# Patient Record
Sex: Male | Born: 1935 | Race: White | Hispanic: No | Marital: Married | Smoking: Former smoker
Health system: Southern US, Community
[De-identification: ages and names within clinical notes are randomized; demographics above are authoritative.]

## PROBLEM LIST (undated history)

## (undated) DIAGNOSIS — H9190 Unspecified hearing loss, unspecified ear: Secondary | ICD-10-CM

## (undated) DIAGNOSIS — F0390 Unspecified dementia without behavioral disturbance: Secondary | ICD-10-CM

## (undated) DIAGNOSIS — I429 Cardiomyopathy, unspecified: Secondary | ICD-10-CM

## (undated) DIAGNOSIS — H548 Legal blindness, as defined in USA: Secondary | ICD-10-CM

## (undated) DIAGNOSIS — Z515 Encounter for palliative care: Secondary | ICD-10-CM

## (undated) HISTORY — PX: CATARACT EXTRACTION, BILATERAL: SHX1313

## (undated) HISTORY — PX: PACEMAKER INSERTION: SHX728

## (undated) HISTORY — PX: INSERT / REPLACE / REMOVE PACEMAKER: SUR710

## (undated) HISTORY — PX: TONSILLECTOMY: SUR1361

---

## 1958-11-09 HISTORY — PX: GASTRECTOMY: SHX58

## 1999-10-24 ENCOUNTER — Ambulatory Visit (HOSPITAL_COMMUNITY): Admission: RE | Admit: 1999-10-24 | Discharge: 1999-10-25 | Payer: Self-pay

## 2001-04-27 ENCOUNTER — Ambulatory Visit (HOSPITAL_COMMUNITY): Admission: RE | Admit: 2001-04-27 | Discharge: 2001-04-27 | Payer: Self-pay | Admitting: Ophthalmology

## 2004-05-27 ENCOUNTER — Inpatient Hospital Stay (HOSPITAL_COMMUNITY): Admission: EM | Admit: 2004-05-27 | Discharge: 2004-05-28 | Payer: Self-pay | Admitting: Emergency Medicine

## 2005-03-04 ENCOUNTER — Inpatient Hospital Stay (HOSPITAL_COMMUNITY): Admission: EM | Admit: 2005-03-04 | Discharge: 2005-03-14 | Payer: Self-pay | Admitting: Emergency Medicine

## 2005-03-04 ENCOUNTER — Ambulatory Visit: Payer: Self-pay | Admitting: Family Medicine

## 2006-03-06 ENCOUNTER — Inpatient Hospital Stay (HOSPITAL_COMMUNITY): Admission: EM | Admit: 2006-03-06 | Discharge: 2006-03-11 | Payer: Self-pay | Admitting: *Deleted

## 2008-11-29 ENCOUNTER — Emergency Department (HOSPITAL_COMMUNITY): Admission: EM | Admit: 2008-11-29 | Discharge: 2008-11-29 | Payer: Self-pay | Admitting: Emergency Medicine

## 2009-05-31 ENCOUNTER — Inpatient Hospital Stay (HOSPITAL_COMMUNITY): Admission: EM | Admit: 2009-05-31 | Discharge: 2009-06-04 | Payer: Self-pay | Admitting: Emergency Medicine

## 2009-05-31 ENCOUNTER — Ambulatory Visit: Payer: Self-pay | Admitting: Internal Medicine

## 2009-06-05 ENCOUNTER — Encounter: Payer: Self-pay | Admitting: Internal Medicine

## 2009-06-17 ENCOUNTER — Ambulatory Visit: Payer: Self-pay

## 2009-06-17 ENCOUNTER — Encounter: Payer: Self-pay | Admitting: Internal Medicine

## 2009-10-14 ENCOUNTER — Encounter: Payer: Self-pay | Admitting: Internal Medicine

## 2010-04-26 ENCOUNTER — Ambulatory Visit: Payer: Self-pay | Admitting: Family Medicine

## 2010-04-26 ENCOUNTER — Inpatient Hospital Stay (HOSPITAL_COMMUNITY): Admission: EM | Admit: 2010-04-26 | Discharge: 2010-04-28 | Payer: Self-pay | Admitting: Emergency Medicine

## 2010-04-27 ENCOUNTER — Encounter: Payer: Self-pay | Admitting: Family Medicine

## 2010-04-27 ENCOUNTER — Ambulatory Visit: Payer: Self-pay | Admitting: Vascular Surgery

## 2010-05-09 ENCOUNTER — Encounter: Admission: RE | Admit: 2010-05-09 | Discharge: 2010-05-09 | Payer: Self-pay | Admitting: Emergency Medicine

## 2011-01-26 LAB — POCT I-STAT, CHEM 8
BUN: 15 mg/dL (ref 6–23)
Calcium, Ion: 1.1 mmol/L — ABNORMAL LOW (ref 1.12–1.32)
Chloride: 97 mEq/L (ref 96–112)
Glucose, Bld: 120 mg/dL — ABNORMAL HIGH (ref 70–99)
Hemoglobin: 12.6 g/dL — ABNORMAL LOW (ref 13.0–17.0)
Sodium: 130 mEq/L — ABNORMAL LOW (ref 135–145)
TCO2: 27 mmol/L (ref 0–100)

## 2011-01-26 LAB — DIFFERENTIAL
Lymphocytes Relative: 12 % (ref 12–46)
Monocytes Absolute: 0.5 10*3/uL (ref 0.1–1.0)
Neutro Abs: 6.2 10*3/uL (ref 1.7–7.7)
Neutrophils Relative %: 79 % — ABNORMAL HIGH (ref 43–77)

## 2011-01-26 LAB — COMPREHENSIVE METABOLIC PANEL
ALT: 20 U/L (ref 0–53)
Albumin: 3.4 g/dL — ABNORMAL LOW (ref 3.5–5.2)
Alkaline Phosphatase: 50 U/L (ref 39–117)
BUN: 12 mg/dL (ref 6–23)
CO2: 28 mEq/L (ref 19–32)
Chloride: 100 mEq/L (ref 96–112)
Creatinine, Ser: 0.77 mg/dL (ref 0.4–1.5)
GFR calc Af Amer: >60 mL/min (ref >60)
Glucose, Bld: 133 mg/dL — ABNORMAL HIGH (ref 70–99)
Potassium: 3.7 mEq/L (ref 3.5–5.1)

## 2011-01-26 LAB — POCT CARDIAC MARKERS: Myoglobin, poc: 42 ng/mL (ref 12–200)

## 2011-01-26 LAB — CARDIAC PANEL(CRET KIN+CKTOT+MB+TROPI)
Relative Index: INVALID (ref 0.0–2.5)
Total CK: 51 U/L (ref 7–232)
Troponin I: 0.03 ng/mL (ref 0.00–0.06)

## 2011-01-26 LAB — URINALYSIS, ROUTINE W REFLEX MICROSCOPIC
Glucose, UA: NEGATIVE mg/dL
Ketones, ur: NEGATIVE mg/dL
Leukocytes, UA: NEGATIVE
Nitrite: NEGATIVE
Protein, ur: NEGATIVE mg/dL
Urobilinogen, UA: 1 mg/dL (ref 0.0–1.0)
pH: 7.5 (ref 5.0–8.0)

## 2011-01-26 LAB — TSH
TSH: 1.113 u[IU]/mL (ref 0.350–4.500)
TSH: 1.142 u[IU]/mL (ref 0.350–4.500)

## 2011-01-26 LAB — PREALBUMIN: Prealbumin: 18.5 mg/dL (ref 18.0–45.0)

## 2011-01-26 LAB — DIGOXIN LEVEL: Digoxin Level: 1.4 ng/mL (ref 0.8–2.0)

## 2011-01-26 LAB — PROTIME-INR: INR: 1.01 (ref 0.00–1.49)

## 2011-01-26 LAB — LIPID PANEL: VLDL: 13 mg/dL (ref 0–40)

## 2011-01-26 LAB — URINE CULTURE

## 2011-01-26 LAB — CBC
HCT: 36.7 % — ABNORMAL LOW (ref 39.0–52.0)
MCHC: 35.1 g/dL (ref 30.0–36.0)
MCV: 90.3 fL (ref 78.0–100.0)
RDW: 13.9 % (ref 11.5–15.5)

## 2011-01-26 LAB — HOMOCYSTEINE: Homocysteine: 11.1 umol/L (ref 4.0–15.4)

## 2011-01-26 LAB — GLUCOSE, CAPILLARY
Glucose-Capillary: 104 mg/dL — ABNORMAL HIGH (ref 70–99)
Glucose-Capillary: 110 mg/dL — ABNORMAL HIGH (ref 70–99)
Glucose-Capillary: 143 mg/dL — ABNORMAL HIGH (ref 70–99)

## 2011-01-26 LAB — URINE MICROSCOPIC-ADD ON

## 2011-02-15 LAB — CBC
HCT: 32.5 % — ABNORMAL LOW (ref 39.0–52.0)
HCT: 35 % — ABNORMAL LOW (ref 39.0–52.0)
Hemoglobin: 11 g/dL — ABNORMAL LOW (ref 13.0–17.0)
Hemoglobin: 11.3 g/dL — ABNORMAL LOW (ref 13.0–17.0)
Hemoglobin: 12 g/dL — ABNORMAL LOW (ref 13.0–17.0)
MCHC: 34.3 g/dL (ref 30.0–36.0)
MCV: 91.7 fL (ref 78.0–100.0)
MCV: 92.3 fL (ref 78.0–100.0)
RBC: 3.46 MIL/uL — ABNORMAL LOW (ref 4.22–5.81)
RBC: 3.79 MIL/uL — ABNORMAL LOW (ref 4.22–5.81)
RDW: 14.1 % (ref 11.5–15.5)
WBC: 6.1 10*3/uL (ref 4.0–10.5)
WBC: 6.4 10*3/uL (ref 4.0–10.5)
WBC: 7.4 10*3/uL (ref 4.0–10.5)

## 2011-02-15 LAB — PROTIME-INR: Prothrombin Time: 13.7 seconds (ref 11.6–15.2)

## 2011-02-15 LAB — URINALYSIS, ROUTINE W REFLEX MICROSCOPIC
Glucose, UA: 500 mg/dL — AB
Hgb urine dipstick: NEGATIVE
Ketones, ur: NEGATIVE mg/dL
pH: 5.5 (ref 5.0–8.0)

## 2011-02-15 LAB — COMPREHENSIVE METABOLIC PANEL
Alkaline Phosphatase: 40 U/L (ref 39–117)
BUN: 18 mg/dL (ref 6–23)
Chloride: 107 mEq/L (ref 96–112)
GFR calc non Af Amer: >60 mL/min (ref >60)
Glucose, Bld: 97 mg/dL (ref 70–99)
Potassium: 4.2 mEq/L (ref 3.5–5.1)
Total Bilirubin: 0.9 mg/dL (ref 0.3–1.2)

## 2011-02-15 LAB — DIFFERENTIAL
Basophils Relative: 0 % (ref 0–1)
Eosinophils Absolute: 0.1 10*3/uL (ref 0.0–0.7)
Eosinophils Relative: 1 % (ref 0–5)
Lymphs Abs: 0.6 10*3/uL — ABNORMAL LOW (ref 0.7–4.0)
Monocytes Absolute: 0.4 10*3/uL (ref 0.1–1.0)
Monocytes Relative: 6 % (ref 3–12)
Neutrophils Relative %: 84 % — ABNORMAL HIGH (ref 43–77)

## 2011-02-15 LAB — BASIC METABOLIC PANEL
CO2: 27 mEq/L (ref 19–32)
CO2: 28 mEq/L (ref 19–32)
Chloride: 104 mEq/L (ref 96–112)
Chloride: 105 mEq/L (ref 96–112)
GFR calc Af Amer: >60 mL/min (ref >60)
Glucose, Bld: 111 mg/dL — ABNORMAL HIGH (ref 70–99)
Potassium: 3.8 mEq/L (ref 3.5–5.1)
Potassium: 5.2 mEq/L — ABNORMAL HIGH (ref 3.5–5.1)
Sodium: 137 mEq/L (ref 135–145)
Sodium: 137 mEq/L (ref 135–145)

## 2011-02-15 LAB — POCT CARDIAC MARKERS
CKMB, poc: <1 ng/mL — ABNORMAL LOW (ref 1.0–8.0)
Myoglobin, poc: 66.3 ng/mL (ref 12–200)
Myoglobin, poc: 87.3 ng/mL (ref 12–200)

## 2011-02-15 LAB — CARDIAC PANEL(CRET KIN+CKTOT+MB+TROPI)
CK, MB: 1.6 ng/mL (ref 0.3–4.0)
Relative Index: 1.3 (ref 0.0–2.5)

## 2011-02-15 LAB — MAGNESIUM: Magnesium: 2.2 mg/dL (ref 1.5–2.5)

## 2011-02-15 LAB — CK TOTAL AND CKMB (NOT AT ARMC): Relative Index: INVALID (ref 0.0–2.5)

## 2011-02-15 LAB — DIGOXIN LEVEL: Digoxin Level: 0.6 ng/mL — ABNORMAL LOW (ref 0.8–2.0)

## 2011-03-24 NOTE — Discharge Summary (Signed)
NAME:  Joshua, Hester NO.:  [id number]   MEDICAL RECORD NO.:  [id number]          PATIENT TYPE:  INP   LOCATION:  3728                         FACILITY:  [facility]   PHYSICIAN:  Antonieta Iba, MD   DATE OF BIRTH:  [birth year]   DATE OF ADMISSION:  05/31/2009  DATE OF DISCHARGE:  06/04/2009                               DISCHARGE SUMMARY   ADDENDUM:  This is an addendum to a discharge summary done yesterday on  June 03, 2009.  [name] was to be discharged on June 03, 2009.  However, he had symptomatic orthostatic blood pressure changes.  Thus,  he was given a 250 bolus of fluid and then 1 liter of fluid overnight.  His laying blood pressure was 90/43.  His sitting blood pressure was  77/41, and standing was 76/42.  He had a fall during the night.  It was  dark; he was a little confused.  He got out of his bed.  He tripped over  the edge of the bed and he was found on all fours crawling to his bed  and he had urinated in his, I believe, water bucket.  He did not sustain  any injuries.  His pacer pocket on the right was mild-to-moderate  swollen.  His orthostatic blood pressures were checked on June 04, 2009.  His laying was 135/69, sitting was 121/64, and standing was 114/62,  after 3 minutes it was 124/65.  He also had a chest x-ray on June 04, 2009 which showed multiple lead pacemaker and ICD in place.  No  pneumothorax was evident.  No acute process was identified.  It was  decided to cut back on his Coreg and to hold his Plavix for at least a 1-  week period of time to let his wound site heal.  Other discharge  instructions are the same as dictated on June 03, 2009.   DISCHARGE MEDICATIONS:  1. Uroxatral 10 mg one every day.  2. He is to hold his Plavix 75 mg for 1 week.  3. Ocuvite 1 tablet every day.  4. Vitamin D3 1000 units every day.  5. Aspirin enteric-coated 81 mg b.i.d.  6. Neurontin 600 mg one 3x a day.  7. Simvastatin 20 mg at night.  8.  Monopril 5 mg one every day.  9. Coreg is decreased to 6.25 mg in the morning and the p.m.  10.Digoxin 0.25 mg one every day.  11.Prednisone 1 mg p.o. 4 per day.  12.He can use Tylenol 500 mg two every 6 hours for his discomfort at      the pacer site.   He was again reminded about his activity restrictions while his  pacemaker heals.  He was seen by Dr. Johney Frame and Dr. Mariah Milling on the day of  discharge, June 04, 2009.      Lezlie Octave, N.P.      Antonieta Iba, MD  Electronically Signed    BB/MEDQ  D:  06/04/2009  T:  06/04/2009  Job:  [id number]   cc:   Hillis Range, MD  [address]  [address]  [place] [address]

## 2011-03-24 NOTE — Op Note (Signed)
NAME:  Joshua Hester, Joshua Hester NO.:  [id number]   MEDICAL RECORD NO.:  [id number]          PATIENT TYPE:  INP   LOCATION:  3728                         FACILITY:  [facility]   PHYSICIAN:  Hillis Range, MD       DATE OF BIRTH:  [birth year]   DATE OF PROCEDURE:  06/03/2009  DATE OF DISCHARGE:                               OPERATIVE REPORT   SURGEON:  Hillis Range, MD   PREPROCEDURE DIAGNOSES:  1. Biventricular implantable cardioverter defibrillator at end-of-life  2. Complete heart block.  3. Nonischemic cardiomyopathy.  4. Chronic systolic dysfunction.   POSTPROCEDURE DIAGNOSES:  1. Biventricular implantable cardioverter defibrillator at end-of-life  2. Complete heart block.  3. Nonischemic cardiomyopathy.  4. Chronic systolic dysfunction.   PROCEDURES:  1. Biventricular implantable cardioverter defibrillator pulse      generator replacement.  2. Defibrillator threshold testing.   INTRODUCTION:  Joshua Hester is a pleasant 77 year old gentleman with a  history of complete heart block who previously had a pacemaker  implanted.  He subsequently developed a nonischemic cardiomyopathy with  New York Heart Association class III heart failure symptoms and  underwent upgrade of his device to a biventricular ICD in 2006.  He has  done very well since that time.  He recently had an episode of  presyncope and had his device interrogated.  His device was found to be  at end of life.  He therefore presents today for biventricular ICD pulse  generator replacement.   DESCRIPTION OF PROCEDURE:  Informed written consent was obtained and the  patient was brought to the electrophysiology lab in the fasting state.  He was adequately sedated with intravenous Valium and Versed as outlined  in the nursing report.  The patient's existing defibrillator was  interrogated and confirmed to be at end of life.  The skin overlying the  right deltopectoral region was prepped and draped in the usual  sterile  fashion by the EP lab staff.  The skin overlying the patient's existing  ICD pocket was infiltrated with lidocaine for local analgesia.  A 6-cm  incision was made over the existing device pockets.  The ICD was exposed  using a combination of sharp and blunt dissection.  Electrocautery was  used to assure hemostasis.  The device was removed from the pocket.  The  leads were interrogated and their integrity was confirmed to be intact.  The previously implanted right ventricular pacing lead was a 60454-09  (serial [id number]) lead which was found to be capped in the pocket.  The  atrial lead was confirmed to be a Production assistant, radio [id number]  (serial [id number]) lead which was implanted on July 31, 2003.  Initial lead measurements revealed an atrial lead P-wave of 1.3 mV with  an impedance of 368 ohms and a threshold of 1.6 volts at 0.8  milliseconds.  The right ventricular defibrillator lead was confirmed to  be a Designer, jewellery model [phone] (serial [id number]) lead implanted on  Mar 10, 2005.  There was no underlying R-wave as the patient was in  complete heart block today.  The impedance was  419 ohms with a threshold  of 1.4 volts at 0.5 milliseconds.  The left ventricular lead was  confirmed to be a Designer, jewellery model [phone] (serial [id number]).  The impedance was 828 ohms with a threshold of 1.3 volts at 0.6  milliseconds.  Again, the leads were examined and their integrity was  confirmed to be intact.  The leads were then connected to a St. Jude  Medical Promote Plus model [phone] (serial [id number]) biventricular ICD.  The pocket was then revised to accommodate the new device.  Electrocautery was again used to assure hemostasis.  The device was then  placed into the pocket and the pocket was irrigated with copious  gentamicin solution.  The pocket was then closed in two layers with 2-0  Vicryl suture for the subcutaneous and subcuticular layers.  Steri-  Strips and a  sterile dressing were then applied.  Ventricular  fibrillation was then induced with a T-shock and a single 15-joule shock  delivered from the device successfully defibrillated the patient to  sinus rhythm.  The charge time was 3.3 seconds with an impedance of 42  ohms.  There was minimal dropout observed.  The procedure was therefore  considered completed.  There were no early apparent complications.   CONCLUSIONS:  1. Previously implanted biventricular implantable cardioverter      defibrillator at end-of-life.  2. Successful biventricular implantable cardioverter defibrillator      pulse generator replacement.  3. Defibrillation threshold less than or equal to 15 joules.  4. No early apparent complications.      Hillis Range, MD  Electronically Signed     JA/MEDQ  D:  06/03/2009  T:  06/04/2009  Job:  [id number]   cc:   Gerlene Burdock A. Alanda Amass, M.D.  [facility] [place] Cleta Alberts, M.D.

## 2011-03-24 NOTE — H&P (Signed)
NAME:  ALLYN, BERTONI NO.:  [id number]   MEDICAL RECORD NO.:  [id number]          PATIENT TYPE:  EMS   LOCATION:  MAJO                         FACILITY:  [facility]   PHYSICIAN:  Richard A. Alanda Amass, M.D.DATE OF BIRTH:  [birth year]   DATE OF ADMISSION:  05/31/2009  DATE OF DISCHARGE:                              HISTORY & PHYSICAL   CHIEF COMPLAINT:  Weakness.   HISTORY OF PRESENT ILLNESS:  Mr. Kwiatek is an 77 year old male followed  by Dr. Alanda Amass and Dr. Lesle Chris.  He has a history of nonischemic  cardiomyopathy.  Catheterization in 2005 showed minor coronary disease  with an EF of 15 to 20%.  He had a remote pacemaker implanted in 1994  and in May of 2006 he was upgraded to a St. Jude bi-IV ICD.  He has done  relatively well since, although he has had a couple episodes of  neurocardiogenic syncope.  He was last admitted in 2007 when he had a  syncopal spell.  At that time he had a negative CT for pulmonary  embolism, a negative EEG, and no V-tach on ICD interrogation.  He is  doing pretty well from a cardiac standpoint.  Today he was standing  outside in the heat; it is almost 100 degrees today.  He suddenly became  weak and called out to his friend.  He did not have a syncopal spell.  He was taken at Dr. Ellis Parents office.  Apparently at Dr. Ellis Parents office his  blood pressure was somewhat low but it is improved now.  He was sent to  [facility] ER for further evaluation.  He denies any palpitations or  tachycardia, although this morning he says he was awakened by a sharp  jolt in his chest.  As far as he knows he has never had an ICD  discharge.  The patient denies any unusual dyspnea or chest pain.  He is  now evaluated in the emergency room.   CURRENT MEDICATIONS:  1. Prednisone 4 mg a day which he has taken for the last couple      months.  2. He is also on Lanoxin 0.25 mg a day.  3. Coreg 6.25 mg three times day.  4. Zocor 20 mg a day.  5. Plavix 75 mg a day.  6. Aspirin 81mg  a day.  7. Neurontin - I believe dose is 300 mg b.i.d..  8. Uroxatral  10 mg a day.   ALLERGIES:  PENICILLIN.   PAST MEDICAL HISTORY:  Remarkable as noted above.  He does have a remote  history of PAF but has been in sinus rhythm.  He had a significant GI  bleed in the past and is not on Coumadin.  He has treated hypertension.  He has minor coronary artery disease by catheterization in 2005 as  noted.   SOCIAL HISTORY:  He is married.  He is a nonsmoker.  He lives with his  wife.  He has seven children.   FAMILY HISTORY:  Unremarkable.   REVIEW OF SYSTEMS:  Essentially unremarkable except for as noted above.  He has had remote cataract  surgery in 2005.  He does have a history of  BPH.  He has a history of radiculopathy secondary to shingles which has  bothered him since the 1990s.  I believe he was recently put on  prednisone to see if this would help with his chronic pain.   PHYSICAL EXAMINATION IN THE EMERGENCY ROOM:  VITAL SIGNS:  Blood  pressure 130/70, pulse 70, temperature 97.6.  GENERAL:  He is a well-developed, very tannedd elderly male in no acute  distress.  HEENT:  Normocephalic.  Extraocular muscles intact. Sclerae anicteric.  NECK:  Without JVD or bruit.  CHEST:  Clear to auscultation and percussion.  CARDIAC:  Reveals regular rate and rhythm without murmur, rub or gallop.  Normal S1 and S2.  ABDOMEN:  Nontender.  No hepatosplenomegaly.  EXTREMITIES:  Without edema.  NEUROLOGIC:  Exam is grossly intact.  He is awake, alert, oriented and  cooperative.  Moves all extremities without obvious deficit.   LABS:  White count 7.4, hemoglobin 12.0, hematocrit 35, platelets 177.  Chest x-ray shows no active disease.  Chemistry shows sodium 137,  potassium 5.2, BUN 21, creatinine 1.07, glucose 117.   IMPRESSION:  1. Weakness and near syncope, rule out ventricular tachycardia.  2. Nonischemic cardiomyopathy.  3. Bi-V ICD implant in 2006 with a St. Jude  device.  4. Remote paroxysmal atrial fibrillation, initial pacemaker was      implanted in 1994.  5. History of significant GI bleed in the past, the patient is not on      Coumadin.  6. Treated hypertension.  7. Minor coronary disease at catheterization in 2005.  8. 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  9. Benign prostatic hypertrophy.   PLAN:  The patient will be evaluated by Dr. Allyson Sabal.  Will  go ahead and  interrogate his defibrillator.      Abelino Derrick, P.A.      Richard A. Alanda Amass, M.D.  Electronically Signed    LKK/MEDQ  D:  05/31/2009  T:  05/31/2009  Job:  [id number]

## 2011-03-24 NOTE — Discharge Summary (Signed)
NAME:  Joshua Hester, WEEDMAN NO.:  [id number]   MEDICAL RECORD NO.:  [id number]          PATIENT TYPE:  INP   LOCATION:  3728                         FACILITY:  [facility]   PHYSICIAN:  Sheliah Mends, MD      DATE OF BIRTH:  03-Aug-1917   DATE OF ADMISSION:  05/31/2009  DATE OF DISCHARGE:  06/03/2009                               DISCHARGE SUMMARY   DISCHARGE DIAGNOSES:  1. Weakness and presyncope, probably related to the heat of 100      degrees as he was outside.  No cardiac abnormalities found to      precipitate this.  2. End-of-life of a biventricular implantable cardioverter-      defibrillator generator.  3. Status post generator change by Dr. Johney Frame on June 03, 2009.  He      had a St. Jude device placed in 2006 generator removed and a St.      Jude Medical Promote CD [id number], serial number [id number] replaced by      Dr. Johney Frame.  4. Nonischemic cardiomyopathy with an initial ejection fraction of 15-      20%, improved after biventricular implantable cardioverter-      defibrillator with an ejection fraction now of 50% by recent      echocardiogram performed in our office on Mar 25, 2009.  5. Mild-to-moderate aortic stenosis by echo with an aortic valve mean      gradient of 12 mg of mercury and a maximum of 23 mg of mercury.  6. Polymyalgia rheumatica on prednisone.  7. History of macular degeneration.  8. Recent Persantine Myoview on Mar 25, 2009, revealing no significant      ischemia, ejection fraction by quantitative gated SPECT was 52%.  9. History of paroxysmal atrial fibrillation.  10.History of gastrointestinal bleed, unable to be on Coumadin.   LABORATORY DATA:  Initial CBC with a hemoglobin of 12, hematocrit of 35,  WBCs 7.4, and platelets 177.  On June 02, 2009, his hemoglobin was 11.0,  hematocrit 31.6, WBCs 6.4, and platelets 158.  Initial sodium 137,  potassium 5.2, BUN 21, creatinine 1.07.  On June 02, 2009, sodium 137,  potassium 3.8, BUN 16,  creatinine 0.79, chloride was 104, and CO2 was  28.  TSH was 0.967, magnesium was 2.2.  Dig level was 0.6.  BNP was 204.  Urinalysis showed no pathology.  Two point-of-care markers were  negative, and the CPK-MB and troponin were negative.  Chest x-ray on  May 31, 2009, showed no active disease, no significant change.   HOSPITAL COURSE:  Mr. Wehner was admitted after having experienced some  weakness and presyncope after standing out in the heat of 100 degrees.  He was admitted to the hospital.  He also said that he has been awoken  that morning with a feeling of sharp jolt to his chest.  He had no  palpitations or tachycardia.  His pacemaker was interrogated, and he was  found to have end-of-life parameters.  Dr. Johney Frame was called for BiV ICD  replacement.  He was seen on June 01, 2009, with plans to replace  his  ICD on June 03, 2009.  This was performed by Dr. Johney Frame.  He had his old  generator explanted, which was a Wellsite geologist and had a Educational psychologist implanted.  DFT is less than or equal to 15J.  No early apparent  complications.  Dr. Johney Frame felt that he could be discharged home 4 hours  after the procedure was completed.   Discharge medications are his same medications as prior, which include:  1. Uroxatral 10 mg 1 everyday.  2. Plavix 75 mg 1 everyday.  3. Ocuvite p.o. 1 everyday.  4. Vitamin D3, 1000 units 1 tablet everyday.  5. Aspirin 81 mg 1 b.i.d.  6. Neurontin 600 mg 1 t.i.d.  7. Zocor 20 mg 1 at night.  8. Monopril 5 mg 1 everyday.  9. Coreg 12.5 mg in the a.m. and 6.25 mg in the p.m.  10.Digoxin 0.25 mg 1 everyday.  11.Prednisone 1 mg 4 tablets everyday.  12.It was also told that he could take Tylenol for soreness of the      pacemaker site.   He was given discharge activity instructions printed.  He should not  wash the area until June 10, 2009.  He should do no driving until he is  seen in the office by Dr. Johney Frame.  He will follow up with Dr.  Johney Frame  first for wound check and then he will see Dr. Alanda Amass end of August  or beginning of September.      Lezlie Octave, N.P.      Sheliah Mends, MD  Electronically Signed    BB/MEDQ  D:  06/03/2009  T:  06/04/2009  Job:  [id number]   cc:   Hillis Range, MD

## 2011-03-24 NOTE — Consult Note (Signed)
NAME:  Joshua Hester, YUSKO NO.:  [id number]   MEDICAL RECORD NO.:  [id number]          PATIENT TYPE:  INP   LOCATION:  3728                         FACILITY:  [facility]   PHYSICIAN:  Hillis Range, MD       DATE OF BIRTH:  May 22, 1917   DATE OF CONSULTATION:  DATE OF DISCHARGE:                                 CONSULTATION   REQUESTING PHYSICIAN:  Sheliah Mends, MD   REASON FOR CONSULTATION:  Biventricular ICD at elective replacement  interval.   HISTORY OF PRESENT ILLNESS:  Joshua Hester is a pleasant 77 year old  gentleman with a history of complete heart block status post prior  pacemaker implantation with subsequent biventricular ICD upgrade for  nonischemic cardiomyopathy with congestive heart failure who now  presents with his device at end-of-life.  The patient reports being in  good health recently.  He notes that on May 31, 2009, he had an episode  of presyncope.  He was subsequently brought to [facility] Emergency  Department where his biventricular ICD was interrogated and found to be  at end-of-life battery status.  He was, therefore, admitted with plans  for a pulse generator replacement at the next available time.  The  patient reports doing very well since having his biventricular ICD  implanted.  His ejection fraction improved from 15-20% and subsequently  50%.  He has presently New York Heart Association class II symptoms with  biventricular pacing.  He denies symptomatic chest pain, shortness of  breath, palpitations, or congestive heart failure.  He did have an  episode of presyncope recently, which was felt to be noncardiac.  He has  a workup which is ongoing by the [facility].  Presently, he is resting comfortably and without complaint.   PAST MEDICAL HISTORY:  1. Complete heart block status post pacemaker implantation in 1994.  2. Nonischemic cardiomyopathy with improvement status post pacemaker      upgrade to a  biventricular ICD in May 2006.  3. Polymyalgia rheumatica.  4. Chronic immunosuppression with prednisone.  5. Prior zoster infection with myositis.  6. Macular degeneration.  7. Benign prostatic hypertrophy.   SURGERIES:  1. Gastrectomy years ago for ulcer.  2. Hernia repair.  3. Biventricular ICD upgrade on Mar 10, 2005.  The device is a Psychiatrist HF model [phone] device.  The ventricular defibrillator      lead is a Designer, jewellery model [phone] (serial [id number]) lead.      The coronary sinus lead is a Designer, jewellery model [id number]      (serial number [id number]) lead.  The previously implanted atrial      Pacesetter model 1222T (serial number [id number]) lead is used.  The      previously implanted ventricular paced sense lead is a IT trainer 1216T (serial number [id number]) lead which was implanted on      July 30, 1993.  This lead is capped in the pocket.   ALLERGIES:  PENICILLIN causes swelling.   Current medications are reviewed in  the MAR.   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  He presently is retired and lives in  [place].  His daughter lives here locally and is very married to  [employer], a physician in our community.  He has a remote  history of tobacco and alcohol use but has not consumed these for more  than 40 years.  He denies drug use.   FAMILY HISTORY:  Unremarkable.   REVIEW OF SYSTEMS:  All systems are reviewed and negative except as  outlined in the HPI above.   PHYSICAL EXAMINATION:  Telemetry reveals an A sensed, V paced rhythm.  VITAL SIGNS:  Blood pressure 109/65, heart rate 70, respirations 22,  sats 97% on room air, afebrile.  GENERAL:  The patient is a well-appearing male in no acute distress.  He  is alert and oriented x3.  HEENT:  Normocephalic, atraumatic.  Sclerae clear.  Conjunctivae pink.  Oropharynx clear.  NECK:  Supple.  No thyromegaly, JVD, or  bruits.  LUNGS:  Clear to auscultation bilaterally.  HEART:  Regular rate and rhythm.  No murmurs, rubs, or gallops.  GI:  Soft, nontender, nondistended.  Positive bowel sounds.  EXTREMITIES:  No clubbing, cyanosis, or edema.  NEUROLOGIC:  Strength and sensation are intact.  SKIN:  No ecchymoses or lacerations.  MUSCULOSKELETAL:  No deformity or atrophy.  PSYCH:  Euthymic mood.  Full affect.  CHEST:  The patient's ICD pocket is well healed with no evidence of  infection or hematoma.   DEVICE INTERROGATION:  The patient's biventricular ICD was interrogated  on May 31, 2009, and revealed a battery at EOL with a voltage of 2.34  volts and a charge time of 14.4 seconds.  The atrial lead threshold is  1.5 volts and 0.4 milliseconds with an impedance of 380 ohms and a P-  wave measuring 2.1 millivolts.  The right ventricular lead R-wave was  not measured due to complete heart block.  The threshold was 0.75 volts  at 0.4 milliseconds and the impedance was 415 ohms.  The left  ventricular lead threshold was 1.5 volts at 0.6 milliseconds and the  impedance was 740 ohms.  No ventricular arrhythmias have been detected,  and no therapies have been delivered.  The patient is 93% ventricular  lead paced and 59% atrial lead paced.  He has not had significant mode  switches.   LABORATORY DATA:  TSH 0.967.  Magnesium 2.2.  Digoxin 0.6.  Potassium  4.2, BUN 18, creatinine 0.95.  White blood cell count 6, hematocrit 32,  platelets 154, INR 1.1.   CHEST X-RAY:  I have reviewed the patient's chest x-ray from May 31, 2009, which reveals no acute airspace disease.  The patient's device and  leads appeared to be intact.   IMPRESSION:  Joshua Hester is a pleasant 77 year old gentleman who has a  history of complete heart block and a nonischemic cardiomyopathy status  post biventricular implantable cardioverter-defibrillator implantation.  His device has been interrogated and is presently at end-of-life  battery  status.  He now presents for a pulse generator replacement.   PLAN:  Risks, benefits, and alternatives to biventricular ICD pulse  generator replacement with possible lead revision if required were  discussed in detail with the patient today.  The risks include but are  not limited to infection,  bleeding, vascular damage, pneumothorax, tamponade, perforation,  worsening renal failure, death, MI, and stroke.  The patient  understands  these risks and wishes to proceed.  We will, therefore, plan for pulse  generator replacement on Monday.      Hillis Range, MD  Electronically Signed     JA/MEDQ  D:  06/01/2009  T:  06/02/2009  Job:  [id number]   cc:   Gerlene Burdock A. Alanda Amass, M.D.  Sheliah Mends, MD  Stan Head. Cleta Alberts, M.D.

## 2011-03-24 NOTE — H&P (Signed)
NAME:  Joshua Hester, Joshua Hester NO.:  [id number]   MEDICAL RECORD NO.:  [id number]          PATIENT TYPE:  EMS   LOCATION:  MAJO                         FACILITY:  [facility]   PHYSICIAN:  Azalia Bilis, MD       DATE OF BIRTH:  April 14, 1917   DATE OF ADMISSION:  05/31/2009  DATE OF DISCHARGE:                              HISTORY & PHYSICAL   ADDRESS:  A review of the office records show that the patient saw Dr.  Susa Griffins in May.  His last assessment of his LV function showed  that his EF was 50%.  He does have mild to moderate AS.  The aortic  valve area was 1.4 sq/cm, which was unchanged from 2009.  He was found  to be approaching ERI.  He did have a recent Cardiolite in May 2010,  that was low risk.  In May Dr. Alanda Amass had suspected he had about  three months before he reached ERI.      Abelino Derrick, P.A.      Azalia Bilis, MD     LKK/MEDQ  D:  05/31/2009  T:  05/31/2009  Job:  [id number]

## 2011-03-27 NOTE — Discharge Summary (Signed)
NAME:  Joshua Hester, Joshua Hester NO.:  [id number]   MEDICAL RECORD NO.:  [id number]          PATIENT TYPE:  INP   LOCATION:  2006                         FACILITY:  [facility]   PHYSICIAN:  Abelino Derrick, P.A.   DATE OF BIRTH:  [birth year]   DATE OF ADMISSION:  03/06/2006  DATE OF DISCHARGE:  03/11/2006                                 DISCHARGE SUMMARY   ADDENDUM:  [name]'s EEG was normal.      Eda Paschal. Kilroy, P.ALenard Lance  D:  03/12/2006  T:  03/13/2006  Job:  [id number]   cc:   Brett Canales A. Cleta Alberts, M.D.  Fax: [phone]

## 2011-03-27 NOTE — Discharge Summary (Signed)
NAME:  Joshua Hester, Joshua Hester NO.:  [id number]   MEDICAL RECORD NO.:  [id number]          PATIENT TYPE:  INP   LOCATION:  2002                         FACILITY:  [facility]   PHYSICIAN:  Richard A. Alanda Amass, M.D.DATE OF BIRTH:  01-Mar-1917   DATE OF ADMISSION:  03/03/2005  DATE OF DISCHARGE:  03/14/2005                                 DISCHARGE SUMMARY   ADDENDUM:  We added metoprolol to Joshua Hester medications at discharge to  help with rate control. This can be discontinued in the future. The plan was  to not go up on his Coreg to quickly.      LKK/MEDQ  D:  03/14/2005  T:  03/14/2005  Job:  [id number]

## 2011-03-27 NOTE — H&P (Signed)
NAME:  KAYCE, BETTY NO.:  [id number]   MEDICAL RECORD NO.:  [id number]          PATIENT TYPE:  INP   LOCATION:  2014                         FACILITY:  [facility]   PHYSICIAN:  Wayne A. Sheffield Slider, M.D.    DATE OF BIRTH:  1917-07-27   DATE OF ADMISSION:  03/03/2005  DATE OF DISCHARGE:                                HISTORY & PHYSICAL   HISTORY OF PRESENT ILLNESS:  This is an 77 year old white male with a  history of nonischemic cardiomyopathy, hypertension, status post pacemaker  insertion who presents with dizziness, lightheadedness, and a feeling of  passing out and fainting on standing from a sitting position.  The patient  also developed confusion, disorientation and unsteady gait with slurred  speech.  No history of chest pain, palpitations, shortness of breath,  weakness or numbness.   PAST MEDICAL HISTORY:  1.  Known ischemic cardiomyopathy with an ejection fraction of 15-20% at      catheterization done in July 2005.  2.  Status post pacemaker for bradycardia.  3.  Hypertension.  4.  History of gastrointestinal bleeding.  5.  History of atrial fibrillation.   MEDICATIONS:  1.  Monopril 10 mg p.o. daily.  2.  Furosemide 20 mg p.o. daily.  3.  Digoxin 0.125 mg p.o. daily.  4.  Atenolol 25 mg p.o. daily.  5.  Neurontin 100 mg p.o. daily.  6.  Proscar 5 mg p.o. daily.  7.  Aspirin 325 mg p.o. daily.   ALLERGIES:  MORPHINE.   FAMILY HISTORY:  Unremarkable.   PERSONAL AND SOCIAL HISTORY:  The patient lives with wife and has 7  children.  Two children live in [place].  No smoking.  No alcohol use.   REVIEW OF SYSTEMS:  A 12 system review is negative except for dizziness,  lightheadedness, confusion with slurred speech.  No history of chest pain,  palpitations, shortness of breath, weakness, numbness, urinary or bowel  disturbances.   PHYSICAL EXAMINATION:  VITAL SIGNS:  Blood pressure 125/72 with a rate of  72, respiratory rate of 17, temperature 97.1,  O2 saturation of 93% on room  air.  GENERAL:  The patient is awake, coherent, pleasant white male oriented x3,  not in distress.  HEENT:  Normocephalic, atraumatic.  Pupils are equally round and reactive to  light bilaterally.  Slightly pale conjunctivae.  Anicteric sclerae.  Tympanic membranes are normal.  Mild pharyngeal congestion.  Fair dentition.  Moist oral mucosa.  NECK:  No JVD.  CARDIOVASCULAR __________:  Regular rate and rhythm.  Normal S1 and S2.  No  murmurs.  RESPIRATORY:  Decreased breath sounds bilaterally.  Positive inspiratory  crackles both bases of the lung fields, left more than the right.  No  wheezes.  ABDOMEN:  Positive bowel sounds.  Soft, nontender.  No masses.  EXTREMITIES:  No edema, cyanosis.  Peripheral pulses 1+.  NEUROLOGIC:  Cranial nerves II-XII intact.  Motor strength 5/5 in both upper  and lower extremities. No arm drift.  Reflexes 1+ bilaterally.  Sensory  intact.  Cerebellar:  Fair finger-to-nose test.  Gait steady.  LABORATORY DATA:  Hemoglobin 11.3, hematocrit 32.4.  WBC 6.5, platelet count  230,000.  Sodium 132, potassium 4.0, chloride 100, CO2 23, BUN 22,  creatinine 0.7.   ASSESSMENT:  This is an 77 year old white male admitted for presyncope.  1.  Presyncope.  Differential diagnosis is orthostatic hypotension, anemia      and arrhythmia.  The patient is given gentle rehydration in the ED.  We      will need to consult cardiology for interrogation of pacemaker.  This is      already due.  In the past, he has had arrhythmias with pacemaker with      __________.  He will need to upgrade pacemaker.  2.  Rule out transient ischemic attack, consistent with a history of slurred      speech, however, no other neurological findings.  Will not do further      imaging for now.  Monitor neurologic status.  Presently, the patient is      on aspirin.  3.  Hypertension on Monopril.  Presently, the blood pressure is stable.  4.  Nonischemic  cardiomyopathy.  Presently, no signs and symptoms of      congestion with __________.  5.  Anemia.  Baseline hemoglobin 12.2 in July 2005.  Will guaiac stools,      check iron studies, __________ nutrition.  6.  Heart __________ diseased.  IVF to [facility].      MC/MEDQ  D:  03/04/2005  T:  03/04/2005  Job:  [id number]   cc:   Brett Canales A. Cleta Alberts, M.D.  [address]  [place]  [place] [address]  Fax: [phone]

## 2011-03-27 NOTE — Discharge Summary (Signed)
NAME:  Joshua Hester, Joshua Hester NO.:  [id number]   MEDICAL RECORD NO.:  [id number]                   PATIENT TYPE:  INP   LOCATION:  2028                                 FACILITY:  [facility]   PHYSICIAN:  Darcella Gasman. Ingold, N.P.               DATE OF BIRTH:  Jul 03, 1917   DATE OF ADMISSION:  05/27/2004  DATE OF DISCHARGE:  05/28/2004                           DISCHARGE SUMMARY - REFERRING   DISCHARGE DIAGNOSES:  1. Chest pain with negative CK MB's, mildly elevated troponin and non-     obstructive coronary disease per cardiac catheterization.  2. Light headedness, resolved.  3. Cardiomyopathy, nonischemic. Ejection fraction of 15% to 20% by     catheterization.  4. History of syncope with bradycardia status post permanent pacemaker,     Pacesetter Synchrony II, Model [id number], Serial [id number], near end of life.  5. Tachycardia, either ventricular or supraventricular tachycardia on     pacemaker interrogation.  6. History of left bundle branch block.  7. Hypertension.  8. History of gastrointestinal bleed.   CONDITION ON DISCHARGE:  Improved and stable.   PROCEDURE:  On May 27, 2004:  Combined left heart catheterization by Dr.  Julieanne Manson.   DISCHARGE MEDICATIONS:  1. Proscar 5 mg 1 daily.  2. Monopril 10 mg daily.  3. Lasix 20 mg daily.  4. Atenolol 25 mg daily.  5. Enteric coated aspirin 325 daily.  6. Lanoxin 0.125 daily.  7. Neurontin 100 mg daily.  8. Darvocet N-100 1 or 2 every 4 hours as needed for chest pain.   DISCHARGE INSTRUCTIONS:  1. No strenuous activity for 3 to 4 days.  2. Low-salt diet.  3. Wash catheterization site with soap and water. Call if any bleeding,     swelling or drainage.  4. If any more symptoms, call our office.  5. Followup with Dr. Alanda Amass June 16, 2004 at 2:15 p.m.   HISTORY OF PRESENT ILLNESS:  An 77 year old white male with a history of  syncope and permanent pacemaker placed in 1994 with history of non-ischemic  coronary disease. He presented to his son-in-law, Dr. Ernestene Mention office on May 27, 2004 after episodes of chest discomfort. He had been having 3 days of  chest discomfort, predominantly at rest. It lasted up to 30 minutes with  some breathlessness. On May 27, 2004, he was riding in an automobile. Had  significant chest pain. Became light headed with that. He also became  somewhat disoriented. He went to Urgent Care, was placed on oxygen and had  dramatic improvement in his symptoms. Dr. Perrin Maltese called out office. He was  brought to the hospital and underwent cardiac catheterization with his  pacemaker. We were unsure of any electrocardiogram changes. Cardiac  catheterization revealed calcium of the aortic valve. Only a 20% proximal  right coronary artery stenosis. Otherwise, patent course. Ejection fraction  was 15% to 20% and he had 1+ mitral regurgitation.  PAST MEDICAL HISTORY:  Positive for non-ischemic coronary disease. Permanent  pacemaker as stated and mild mitral regurgitation. He also has mild  concentric left ventricular hypertrophy with additional basal septal  hypertrophy. His last 2-D echocardiogram was in January of 2005. He does  have, per Dr. Kandis Cocking last visit in December of 2004, at that time he  was having atrial fibrillation. Here in the hospital, he has been pacing  atrially and ventricularly sinus rhythm with obvious T waves. Other history  does include macular degeneration.   ADMISSION MEDICATIONS:  Outpatient medicines are essentially the same as  discharge medications. There have been no changes except the addition of the  Darvocet.   FAMILY HISTORY/SOCIAL HISTORY/REVIEW OF SYSTEMS:  See H&P.   PHYSICAL EXAMINATION:  VITAL SIGNS:  At discharge blood pressure 103/67,  pulse 71, respiratory rate 20, temperature 96.8. Oxygen saturation 92%.  CARDIOVASCULAR:  Heart sounds S1 muffled, S2 normal, long systolic murmur,  positive MANTA.  LUNGS:  Clear.  ABDOMEN:   Soft with positive bowel sounds.  EXTREMITIES:  Without edema. Catheterization site stable.   LABORATORY DATA:  At discharge, hemoglobin 12.2, hematocrit 34.9, platelets  210,000. White blood cell count 6. Sodium 134, potassium 3.9, BUN 16,  creatinine 0.9, glucose 88. CK's 159, 137, 106. MB's 3.7, 2.6, and 2.7.  Troponin I #3 was 0.17, #2 was 0.03, and #1 was 0.01. Brain natriuretic  peptide 228, TSH 1.029.   CT of the chest negative for aortic dissection. No pulmonary embolism. No  cardiomyopathy. CT of the abdomen, no abdominal aortic aneurysm. Did have  calcific plaque at the origin of both renal arteries, possible renal artery  stenosis mildly bilaterally. Chest  x-ray, cardiomegaly. No heart failure. Cardiac catheterization as previously  described. Pacemaker was interrogated. He had some either atrial ventricular  tachycardia. The memory was deleted and we will start again and have him see  Dr. Alanda Amass back for further evaluation.   Pacemaker is end of life. Electrocardiogram shows AV pacing.   HOSPITAL COURSE:  The patient remained stable after cardiac catheterization.  Troponin was slightly elevated. CK MB's were negative. Pacemaker is end of  life. Possibility of arrhythmia.   PLAN:  We will discharge the patient home. Continue his aspirin and will re-  evaluate him in 2 weeks. I will review the chart with Dr. Alanda Amass on  Monday, when he is again in town. Our main concern is does he need to be  upgraded to a bi-V ICD versus generator change out. Dr. Jacinto Halim saw him and  examined him. He has not had any  arrhythmia's except for an occasional premature ventricular contraction and  these are mainly fused beats on the monitor here at the hospital. He will  certainly call us if he has any further problems. I believe the chest wall  pain to be more musculoskeletal and will treat with Darvocet.                                               Darcella Gasman. Annie Paras, N.P.     LRI/MEDQ  D:  05/28/2004  T:  05/28/2004  Job:  [id number]   cc:   Jonita Albee, M.D.  [facility]  [address]  [place]  [place] [address]  Fax: [phone]   Cristy Hilts. Jacinto Halim, M.D.  [address]  [place]  [place]  [place] [address]  Fax: [phone]

## 2011-03-27 NOTE — Consult Note (Signed)
NAME:  Joshua Hester, Joshua Hester NO.:  [id number]   MEDICAL RECORD NO.:  [id number]          PATIENT TYPE:  INP   LOCATION:  2006                         FACILITY:  [facility]   PHYSICIAN:  Marolyn Hammock. Reynolds, M.D.DATE OF BIRTH:  29-May-1917   DATE OF CONSULTATION:  03/09/2006  DATE OF DISCHARGE:                                   CONSULTATION   REASON FOR EVALUATION:  Loss of consciousness.   HISTORY OF PRESENT ILLNESS:  This is a re-consultation for this existing  [facility] patient, an 77 year old man seen by Dr. 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 The patient is admitted after a spell  of witnessed loss of consciousness which he suffered on March 06, 2006. The  patient states he was at [facility] shopping with his wife when he felt he had  sudden onset of a lightheaded sensation. He denies any associated dizziness  or vertigo and had no focal weakness or slurred speech, but just felt  generally weak. He was able to tell his wife that he did not feel well. He  then lost consciousness. He does not recall anything else for a few moments.  According to his wife and the patient's understanding of events, he was  checked by his wife and found to have no carotid pulses. He was placed on  the ground, horizontal, and very quickly woke up. He vomited upon wakening  and very quickly regained consciousness and awareness. He states that his  wife saw that his eyes were opened and fixed during the spell. This lasted  only a few seconds. The patient does report that over the last couple of  weeks he has had a few brief episodes of presyncopal sensation similar to  what he felt before losing consciousness, which had been occurring about  every other day for the previous 10 days or so. He denies sensations like  this before. He did not injure himself or bite his tongue. He did not suffer  any  bowel or bladder incontinence. He was brought to the hospital and had  his ICD interrogated which was unremarkable. CT of head upon admission was  unremarkable. He is slightly hyponatremic and hypokalemic in the hospital. A  neurologic consultation is requested for further consideration.   PAST MEDICAL HISTORY:  He has a long history of a nonischemic cardiomyopathy  with an ejection fraction of 10% to 15% with previous pacemaker placement  several years ago and more recent AICD placement.  He has not had any AICD  shock as far as he knows. He does not have a history of significant coronary  artery disease. He has had atrial fibrillation in the past, but not  recently. He has a history of BPH for which he has seen Dr. Earlene Plater in the  past. He has an aforementioned history of zoster radiculopathy which left  him with some residual right leg weakness and pain and takes Neurontin for  that.   FAMILY HISTORY/SOCIAL HISTORY/REVIEW OF SYSTEMS:  As outlined in  the  admission H&P on March 06, 2006, which is reviewed.   MEDICATIONS:  Prior to admission he was taking digoxin, Coreg, Lasix,  Monopril, Proscar, aspirin, Neurontin 300 mg qhs. He is receiving the same  medications in the hospital except his Lasix is presently being held.   PHYSICAL EXAMINATION:  VITAL SIGNS: Temperature 97.0, blood pressure 118/68,  pulse 83, respirations 18, O2 saturation 91% on room air. He had no  orthostatics this a.m.  GENERAL: Alert and in no distress.  HEENT: Cranium normocephalic and atraumatic. Oropharynx benign.  NECK: Supple without carotid or supraclavicular bruits.  HEART: Regular rate and rhythm without murmurs.  NEUROLOGIC: Mental status--he is awake, alert, and fully oriented to time,  place, and person. Recent and remote memory are intact. Attention span,  concentration, and fund of knowledge are all appropriate. Speech is fluent,  but not dysarthric. He has no defects to confrontational naming. He  can  repeat phrase. Mood is euthymic and affect is appropriate. Cranial nerves  and funduscopic exam benign. Pupils are equal and briskly reactive.  Extraocular movements are full without nystagmus. Visual fields full to  confrontation. Hearing is intact to finger rub. Facial sensation is intact  to pinprick. Face, tongue, and palate move normally and symmetrically.  Shoulder shrug strength is normal. Motor testing reveal normal bulk and  tone. Normal strength in all tested extremity muscles. Sensation intact to  light touch, pinprick, and double simultaneous stimulation in all  extremities. Coordination reveals rapid movements are full and well. Finger-  to-nose performed adequately. Gait reveals he rises from chair easily and  stance is normal. He is able to heel walk. He has some trouble toe walking  and tandem walking due to old right leg weakness. Reflexes are symmetric.  Toes are downgoing bilaterally.   LABORATORY REVIEW:  BMET from this morning shows low sodium of 130, low  potassium of 3.4, mildly elevated glucose of 118, and a normal folate and  ferritin level yesterday. PSA is normal. Iron stores are on the low end of  normal. CBC from yesterday reveals white count of 6.3, hemoglobin 11.8,  platelet count 218,000. Cardiac enzymes are negative in the hospital. Lipid  profile shows elevated LDL of 139, low HDL of 38. TSH is normal. D-dimer is  elevated on admission at 2.89. BNP on admission was normal. Digoxin level  was 0.6.   CT of the head is personally reviewed and demonstrates some mild diffuse  white matter ischemic changes, but no acute findings.   IMPRESSION:  1.  Syncope. The lack of focal features, lack of a postictal state, vomiting      on awakening, absence of injury, and the fact that his eyes were not      jerking or darting involuntarily is all more consistent with this as     opposed to a seizure event. There is no clinical evidence at this time      that he  has had a seizure or cerebrovascular event.   PLAN:  EEG is planned today. Will not pursue neurologically any further if  EEG is normal. Would agree with holding some of his hypertensive medications  such as Lasix and if he is consistently hyponatremic suggest possibly  checking a cortisol level. Also agree with stockings and reducing all  possible precipitating factors for vasovagal syncope.   Thank you for the consultation.      Michael L. Thad Ranger, M.D.  Electronically Signed     MLR/MEDQ  D:  03/09/2006  T:  03/09/2006  Job:  [id number]   cc:   Brett Canales A. Cleta Alberts, M.D.  Fax: [phone]

## 2011-03-27 NOTE — Discharge Summary (Signed)
NAME:  Joshua Hester, Joshua Hester NO.:  [id number]   MEDICAL RECORD NO.:  [id number]          PATIENT TYPE:  INP   LOCATION:  2006                         FACILITY:  [facility]   PHYSICIAN:  Richard A. Alanda Amass, M.D.DATE OF BIRTH:  1917/06/09   DATE OF ADMISSION:  03/06/2006  DATE OF DISCHARGE:  03/11/2006                                 DISCHARGE SUMMARY   DISCHARGE DIAGNOSIS:  1.  Syncope of undetermined etiology.  2.  History of nonischemic cardiomyopathy.  3.  Bi-V ICD, interrogated after admission showing no instances of V-tach      her V-fib.  4.  Paroxysmal atrial fibrillation history, currently in sinus.  5.  Mild anemia.  6.  Recent 12-pound weight loss.   HOSPITAL COURSE:  The patient is an 77 year old male followed by Dr.  Alanda Amass with a history of nonischemic cardiomyopathy and hypertension.  He  had a GI bleed in the past.  He has a history of PAF.  He had a Bi-V ICD  implanted in the past.  His catheterization in 2005 showed minor coronary  disease with an EF of 15-20%.  He was shopping on the day of admission when  he had sudden weakness followed by dizziness and loss of consciousness.  Family members said he had no pulse and was not breathing.  He had no recent  illnesses.  He was admitted by Dr. Domingo Sep through the emergency room.  Initial labs were essentially unremarkable with a hemoglobin of 12.4 and  normal renal function.  Chest x-ray showed mild cardiomegaly with no CHF.  EKG was paced.  His ICD was interrogated and showed no evidence of VT or VF.  His D-dimer was elevated but a CT of his chest showed no evidence of  pulmonary embolism.  The patient was followed on telemetry and ambulated.  He did have some transient orthostatic hypotension and we held his ACE  inhibitor and diuretics.  Iron studies were obtained which showed normal  iron level of 48, TIBC 349, percent sat of 14.  Folate and ferritin within  normal limits.  An EEG was ordered by the  neurology service, the report is  pending at the time of dictation.  He will be sent home later today if this  was negative.  He had no further episodes of syncope or presyncope.  Ultimately we suspected it may have been secondary to dehydration and  antihypertensives.   DISCHARGE MEDICATIONS:  1.  Enteric-coated aspirin once a day.  2.  Coreg 6.25 mg twice a day.  3.  Lanoxin 0.25 mg a day.  4.  Proscar 5 mg a day.  5.  Monopril 10 mg a day.  6.  Neurontin 300 mg twice a day.   LABS:  White count 7.1, hemoglobin 11.7, hematocrit 34, platelets 227.  Sodium 132, potassium 3.6, BUN 90, creatinine 1.1.  LFTs normal.  Troponins  are negative.  Cholesterol is 205, LDL was 139. Iron studies were as noted  above, D-dimer was 2.89, digoxin level 0.6.  BNP 61, TSH 0.86. CT of the  head showed atrophy, chronic microvascular changes.  No acute stroke.   DISPOSITION:  The patient is discharged in stable condition pending his EEG  results.  He will follow up with Dr. Alanda Amass in a couple of weeks.  We  will keep his Lasix on hold for now.      Abelino Derrick, P.A.      Richard A. Alanda Amass, M.D.  Electronically Signed    LKK/MEDQ  D:  03/11/2006  T:  03/12/2006  Job:  [id number]   cc:   Casimiro Needle L. Thad Ranger, M.D.  Fax: [phone]

## 2011-03-27 NOTE — Op Note (Signed)
[place]. [facility]  Patient:    Joshua Hester, Joshua Hester                    MRN: [id number] Proc. Date: 04/27/01 Adm. Date:  [id number] Disc. Date: [id number] Attending:  Tommy Medal                           Operative Report  INDICATIONS AND JUSTIFICATIONS FOR THE PROCEDURE:  The patient has been followed in my office for 11 years since May 1991 and has had previous cataract surgery and also has severe right macular degeneration.  He was seen most recently on Mar 31, 2001 and interested in discussing the redundant skin of his upper eyelids.  Indeed, he had severe redundancy of the skin which blocked his upper field of vision and causes fatigue.  He can tell if he lifts the skin that it opens up the visual field and this does bother him. Otherwise, he has lens implants in each eye with macular disease in the right and good vision in the left.  The pupils, motility, conjunctivae, cornea, anterior chambers are normal and the optic disks are normal.  Visual field testing and photographs were done to document the loss of field when the skin was allowed to relax compared to when it was taped.  Photographs document the skin position and actually, when the patient is relaxed, the skin comes to the edge of the pupil.  Medically, he should be stable with respect to having upper eyelid optical blepharoplasties.  JUSTIFICATION FOR PERFORMING PROCEDURE IN OUTPATIENT SETTING:  Routine.  JUSTIFICATION FOR OVERNIGHT STAY:  None.  PREOPERATIVE DIAGNOSIS:  Severe blepharochalasis with visual impairment.  POSTOPERATIVE DIAGNOSIS:  Severe blepharochalasis with visual impairment.  OPERATION PERFORMED:  Upper eyelid optical blepharoplasties.  SURGEON:  Robert L. Dione Booze, M.D.  ANESTHESIA:  1% Xylocaine with epinephrine.  DESCRIPTION OF PROCEDURE:  The patient arrived in the minor surgery room at [facility]. [facility] and his face was prepped and draped in  the routine fashion.  A frontal nerve block was given to each upper eyelid and some additional Xylocaine was given to the temporal portion of each lid. The skin to be removed was carefully demarcated and excised along with some underlying fatty.  Bleeding was controlled with pressure.  Each wound was then closed with a running 6-0 nylon suture and pressure patches were applied.  The patient left the minor room having done well.  FOLLOW-UP:  The patient will be seen in my office in five days to have his sutures removed.  He is to remove the patches in three hours and will use compresses twice daily.  He is to use Polysporin ointment in his eyes at night. DD:  04/27/01 TD:  04/27/01 Job: [id number]

## 2011-03-27 NOTE — Op Note (Signed)
NAME:  Joshua Hester, Joshua Hester NO.:  [id number]   MEDICAL RECORD NO.:  [id number]          PATIENT TYPE:  INP   LOCATION:  2908                         FACILITY:  [facility]   PHYSICIAN:  Richard A. Alanda Amass, M.D.DATE OF BIRTH:  [birth year]   DATE OF PROCEDURE:  03/10/2005  DATE OF DISCHARGE:                                 OPERATIVE REPORT   PREOPERATIVE DIAGNOSES:  1. Nonischemic cardiomyopathy; ejection fraction less than 25% documented.  2. Normal coronary arteries with prior catheterizations; last July 2005,      ejection fraction 15-20%.  3. Class 3 congestive heart failure.  4. Chronic left bundle branch block.  5. Neurocardiogenic syncope with recurrent vasodepressor reactions and      presyncope despite DDD-R pacing.  6. Status post remote permanent transvenous pacemaker, July 30, 1993,      with Pacesetter Synchrony 2 for recurrent syncope and presyncope with      documented carotid sinus hypersensitivity greater than 4.5 second      pauses, associated left bundle branch block (L-BBB) and vasodepressor      and cardioinhibitory responses with neurocardiogenic syncope.  7. End of life DDD pulse generator recently reaching ERI with documented      magnet rate drop, 18 kilo-ohm battery impedance and battery voltage      drop to less than 2.35 volts.     POSTOPERATIVE DIAGNOSES:  1. Nonischemic cardiomyopathy; ejection fraction less than 25%,      documented.  2. Normal coronary arteries with prior catheterizations; last July 2005,      ejection fraction 15-20%.  3. Class 3 congestive heart failure.  4. Chronic left bundle branch block.  5. Neurocardiogenic syncope with recurrent vasodepressor reaction and      presyncope despite DDD-R pacing.  6. Status post remote permanent transvenous pacemaker, July 30, 1993,      with Pacesetter Synchrony 2 for recurrent syncope and presyncope with      documented carotid sinus hypersensitivity greater 4.5 second  pauses,      associated left bundle branch block (L-BBB) and vasodepressor and      cardioinhibitory responses with neurocardiogenic syncope.  7. End of life DDD pulse generator recently reaching ERI with documented      magnet rate drop, 18 kilo-ohm battery impedance and battery voltage      drop to less than 2.35 volts.     OPERATION PERFORMED:  1. Implantation of St. Jude Medical Atlas HF, model Q097439, serial number      [id number], Bi-V implantable cardioverter-defibrillator device with,  2. New St. Jude Medical sensing and dual shocking coil right ventricular      electrode, #7001/65, serial number [id number],  3. Implantation of coronary sinus bipolar St. Jude Medical number 1056T-      [id number], serial number [id number] electrode,  4. Utilization of previously placed atrial Pacesetter #1222T, serial      number [id number], electrode,  5. Capped prior Pacesetter [id number], ventricular electrode, serial number      [id number] (DOI July 30, 1993),  6. Pacemaker pocket revision.  7. Biventricular upgrade.  8. Venous angiogram.  9.  Coronary sinus angiogram.  10.Temporary transvenous pacemaker.     CARDIOLOGIST/IMPLANTING PHYSICIAN:  Richard A. Alanda Amass, M.D.   CARDIOLOGIST/PRIMARY OPERATOR:  Mark E. Severiano Gilbert, M.D., proctor.   ANESTHESIA:  One percent local Xylocaine, Nubain 3 mg IV and Versed 1 mg IV  in divided doses.   ASSOCIATED MEDICATIONS:  1. Atropine 2 mg IV in divided doses.  2. Narcan 0.8 mg IV in divided doses.     PREOPERATIVE MEDICATIONS:  Benadryl 25 mg p.o. and Valium 2 mg p.o.   ANTIBIOTIC PROPHYLAXIS:  Vancomycin 1 gram IV.   COMPLICATIONS:  Transient hypotension reversed with fluid administration;  atropine and low dose dopamine with Narcan administration in part related to  overhydration as well as underlying neurocardiogenic syncope with  vasodepressor response as has happened in the past.   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  He is married with seven children and 18 grandchildren.  He has  nonischemic cardiomyopathy with congestive heart failure, left bundle branch  block, and remote permanent transvenous pacemaker, July 30, 1993, with  normal coronary arteries.  He has associated vasodepressor and  cardioinhibitory responses with carotid sinus hypersensitivity.  He is  pacemaker-dependent and has recently reached end of life on his generator.  We had considered upgrading him to a biventricular pacer based care HF and  companion data for symptomatic and survival improvement as well as  prophylactic ICD based on ____________ criteria.  This was again reviewed  with the patient in the hospital and informed consent was obtained to  proceed.   DESCRIPTION OF OPERATION:  The patient was brought to the second floor CP  lab, room #6, in the post absorptive state. The right groin and the right  anterior chest were prepped and draped in the usual manner.  One percent  Xylocaine was used for local anesthesia.  The right common femoral artery  was entered with a single anterior puncture using an 18 thin wall needle for  venous access.  A 5 French sheath was inserted and a temporary transvenous  pacemaker was inserted under fluoroscopic control in the RV apex with  temporary transvenous pacing established.   A right upper extremity venogram was done with dilute dye, 20 mL, and cine  angiography demonstrating a patent cephalic and subclavian vein with  previously placed leads in place that appeared intact fluoroscopically.  An  infraclavicular transverse curvilinear incision was performed over the  previously placed incision under 1% local Xylocaine.  Blunt dissection was  used along with limited electrocautery to control hemostasis.  The patient  was given intermittent Nubain, a total of 3 mg, and Versed 1 mg for sedation.  He was quite anxious.  Blood pressure was 140 with noninvasive  monitoring  and he was atrial tracking with ventricular pacing with sinus  tachycardia compatible with a moderately hyperadrenergic response.  The  fibrous capsule was identified and opened.  The generator was freed up from  the pocket; had previously been programmed to the bipolar mode and delivered  from the pocket without difficulty.  A pulse generator pocket was then  formed using blunt dissection and limited electrocautery to control  hemostasis.  The subclavian vein was entered under fluoroscopic control with  an 18 thin wall needle and a #7 French peel away safe sheath was introduced  over a stainless steel J tip guidewire.  The ventricular electrode was  introduced and positioned in the IVC initially with the sheath  peeled away.  Before placing the RV lead and establishing temporary transvenous pacing the  patient developed hypotension and became sleepy and difficult to arouse.  There was no pneumothorax on fluoroscopic control.  There was no difficulty  with entrance and there was no manipulation or fluoroscopic evidence  to  suggest any tamponade.  There was no lead manipulation up to this point.  This was felt to probably be due to overmedication and vasodepressor  response.  He was given D5 normal saline, intermittent atropine and  dopamine, which was titrated.  His blood pressure promptly returned to  normal along with Narcan 0.4 mg IV times two.  The blood pressure was  stabilized at 110-130, watched throughout the procedure and the patient did  not require any dopamine about 1-4 microdrops per minute for the remainder  of the procedure, which he tolerated well.   No further IV anesthesia was given.  His O2 sats remained stable.   At this point the right ventricular lead was positioned in the RV apex and  the active fixation lead was screwed in under fluoroscopic control with good  right ventricular thresholds.  The lead was secured at the insertion site  where the previously placed #1  silk suture around a tissue sewing collar to  prevent migration and further secured with a single #1 interrupted silk  suture around a silicone sewing collar.  Later in the procedure a second  retention suture was used around the silicon sewing collar, #1 silk.   Slightly more lateral the subclavian vein was then again punctured for  coronary sinus lead placement (LV lead).  An 8 [place] Appel peel away  introducer with a 60-degree curve and a CS slightly shaped inner cannulator  catheter were advanced over stainless steel Teflon-coated guidewire.  Using  a combination of the CS cannulator and the sheath with a 0.035 inch Wholey  wire the CS was cannulated.  CS angiogram was performed in several  projections by hand injection through the cannulator catheter.  This  demonstrated a moderate-sized inferior vein that was felt to less suitable  and a good size posterolateral vein  the mid coronary sinus that had a moderate bend at its takeoff.   Initial attempts at passing an Asahi 0.014 inch soft guidewire were not  successful because of the cannulator catheter not being coaxial.  The  guidewire was pulled back and then careful manipulation of the cannulator  into the lateral vein was accomplished, and then the vein was then traversed  with a 0.014 inch coronary Whisper wire in the distal lateral position.  The  CS cannulator was removed and the Appel sheath was positioned up near the  origin of the vein to provide backup.  A bipolar coronary sinus (LV) CS  passive fixation #1056T, 86CN LV lead was then passed over the guidewire and  positioned in the distal posterolateral vein.  Good was confirmed by  fluoroscopy in multiple orthogonal projections.  The guidewire was pulled  back and LV threshold testing was performed. There was no diaphragmatic  stimulation at 10-volt output.  There were excellent acute thresholds. The 8  French sheath was then pulled back out of the coronary sinus under   fluoroscopic control, then removed and peeled away in standard fashion  without difficulty and without dislodgement of any electrodes, and good  position of the coronary sinus lead.  The coronary sinus lead was secured  with two interrupted #1 silk sutures around a silicon sewing collar  to the  underlying muscle and fascia.  The second RV lead suture was then tied.  The  previously placed RV lead was then capped with a silicone cap and two simple  sutures.  This was delivered into the inferior portion of the pocket.  The  pocket was irrigated with 500 mg of kanamycin solution.  The sponge count  was correct.  There was good fluoroscopic position of all electrodes.   The generator was then hooked to the previously implanted atrial, the newly  implanted RV paced sense shocking lead and the coronary sinus lead in the  proper sequence with all hex nuts tightened, and good pin connector  position.  The generator was delivered into the pocket with the electrodes  looped behind.   The subcutaneous tissue was closed with separate running layers of Vicryl  suture and the skin was closed with 4-0 subcuticular Vicryl sutures.  Steri-  strips were applied.   Fluoroscopy showed good position of all electrodes; and patient was atrial  tracking and ventricular pacing at this time.   Interrogation was done showing integrity of all electrodes.  It was elected  not to do DFT testing at this time since the patient did have transient  hypotension as outlined above presumably related to sedation.  It was felt  that since this was a high output device  ______________ 36 joules delivered  that this would be adequate for primary prevention and/or the patient could  be considered for DFT testing at a later time. There was significant  narrowing of the patient's QRS with biventricular pacing and good electrical  lead position noted by pacing access.  The temporary pacer was removed under fluoroscopic control  without  difficulty and the patient was transferred to the holding area for  postoperative care and monitoring in stable condition with blood pressure of  130-140.   ELA indicator is less than 2.3 volts output.  The previously placed  generator was explanted early in the procedure after insertion of the new RV  and coronary sinus lead (DOI July 30, 1993).   Atrial threshold:  Minimal threshold for capture 1.8 volts at 0.5 msec,  resistance 480 ohms, P waves 3.9 mV.  RV threshold:  Threshold 0.6 volts at 0.5 msec.  R wave was not measured  because the patient is pacer dependent.  Resistance 815 ohms.  LV lead:  Minimal threshold 1 volt at 0.5 msec, resistance 1379 ohms.  No R  waves measured.  As mentioned the device is an Atlas HF, model number B-343.   The patient will be monitored postoperatively.  Medications will be adjusted  for nonischemic cardiomyopathy and recurrent vasodepressor reactions along  with appropriate pacemaker programming.      RAW/MEDQ  D:  03/10/2005  T:  03/11/2005  Job:  [id number]   cc:   Cardiopulmonary Laboratory   Brett Canales A. Cleta Alberts, M.D.  [address]  [place]  [place] [address]  Fax: [phone]   Jonita Albee, M.D.  [facility]  [address]  [place]  [place] [address]  Fax: [phone]   Janeece Riggers. Severiano Gilbert, M.D.  Fax: [phone]

## 2011-03-27 NOTE — Cardiovascular Report (Signed)
NAME:  Joshua Hester, Joshua Hester NO.:  [id number]   MEDICAL RECORD NO.:  [id number]                   PATIENT TYPE:  INP   LOCATION:  1833                                 FACILITY:  [facility]   PHYSICIAN:  Thereasa Solo. Little, M.D.              DATE OF BIRTH:  1917/10/24   DATE OF PROCEDURE:  05/27/2004  DATE OF DISCHARGE:                              CARDIAC CATHETERIZATION   INDICATIONS FOR TEST:  This 77 year old male has a permanent pacemaker for  syncope.  It was placed in 1994.  He had his last cardiac catheterization in  1998 that showed no significant coronary disease, but decreased ejection  fraction at 20-25% consistent with a nonischemic cardiomyopathy.   He has had for the last three days episodes of chest discomfort that seem to  occur predominantly at rest.  They have lasted up to 30 minutes in duration  associated with some breathlessness.  Prior to the onset of the chest pain  he was walking a mile a day without any real limitations other than mild  fatigue at the end of his exercise routine.  He has had not PND, orthopnea  or peripheral swelling.  Today, he had a more significant episode of chest  pain that occurred when he was riding in an automobile.  He became very  lightheaded with this.  Said he became disoriented.  He presented to  urgent care and was placed on oxygen and had dramatic improvement in his  symptoms.  His EKG is paced.  Because of the symptoms of chest pain,  shortness of breath he is brought to the cath lab on emergency basis for  cardiac catheterization to rule out an acute myocardial infarction that  could be masked by his paced  EKG.   PROCEDURE:  After obtaining informed consent, the patient was prepped and  draped in the usual sterile fashion exposing the left groin. Following local  anesthetic with 1% Xylocaine, the Seldinger technique was employed and a 6  [place] introducer sheath was placed into the left  femoral artery.   Left and  right coronary arteriography and ventriculography in the RAO projection and  aortic root injection were performed.   COMPLICATIONS:  None.   EQUIPMENT:  6 French Judkins configuration catheters.   TOTAL CONTRAST:  130 mL.   RESULTS:   HEMODYNAMIC MONITORING:  Central aortic pressure was 132/46.  Left  ventricular pressure was 138/4 and there was a 3 mm aortic valve gradient  noted at the time of pullback.  Of note, the rhythm was AV paced.   AORTIC ROOT INJECTION:  The aortic root injection revealed mild  calcification on the aortic valve, mild aortic insufficiency, but the aortic  root itself was not dilated.   VENTRICULOGRAPHY:  Ventriculography in the RAO projection using 30 mL of  contrast at 12 mL per second revealed moderate dilatation of the left  ventricle with what appeared to  be mild left ventricular hypertrophy.  There  was severe global hypokinesis with the ejection fraction of 15-20%.  Mild  (+1) mitral regurgitation was also seen.   CORONARY ARTERIOGRAPHY:  On fluoroscopy, calcification was seen in the  distribution of the left main and proximal LAD.   1. Left main normal, it bifurcated.  2. LAD:  The left anterior descending extended down towards the apex of the     heart, but did not cross the apex of the heart.  It had a small first     diagonal branch and a very large bifurcating second diagonal.  This     entire system was free of disease.  3. Circumflex:  The circumflex gave rise to two OM vessels which were     moderate size.  There was no evidence of any obstruction in this vessel.  4. Right coronary artery:  The right coronary artery was a very large     dominant vessel giving rise to very large PDA and moderate size posterior     lateral branch.  The proximal portion of the RCA was a mild area of 20%     eccentric narrowing.  The remainder of the vessel was free of disease.   CONCLUSION:  1. No significant coronary artery disease.  2.  Severe left ventricular dysfunction with an ejection fraction of 15-20%.  3. Mild mitral regurgitation.  4. Mild aortic insufficiency.  5. Mild left ventricular hypertrophy.   Based on the cath data, I cannot explain his symptoms.  The fact that he had  such a significant improvement in his breathlessness with oxygen raises a  concern that this could have been a pulmonary embolus.  Because of this, I  have ordered a CT scan of his chest and this should also rule out a  dissection as potential explanation.  He needs to be monitored to make sure  he is not having malignant arrhythmias in view of his low ejection fraction.  I have asked the pacemaker rep to interrogate his Pacesetter pacemaker to  see whether or not there is any telemetry that would imply malignant  arrhythmias.                                               Thereasa Solo. Little, M.D.    ABL/MEDQ  D:  05/27/2004  T:  05/27/2004  Job:  [id number]   cc:   Gerlene Burdock A. Alanda Amass, M.D.  [id number] [address]  [place]  [place] [address]  Fax: [phone]   Jonita Albee, M.D.  [facility]  [address]  [place]  [place] [address]  Fax: [phone]

## 2011-03-27 NOTE — Discharge Summary (Signed)
NAME:  Joshua Hester, ELMS NO.:  [id number]   MEDICAL RECORD NO.:  [id number]          PATIENT TYPE:  INP   LOCATION:  2002                         FACILITY:  [facility]   PHYSICIAN:  Richard A. Alanda Amass, M.D.DATE OF BIRTH:  [birth year]   DATE OF ADMISSION:  03/03/2005  DATE OF DISCHARGE:  03/14/2005                                 DISCHARGE SUMMARY   DISCHARGE DIAGNOSES:  1. Presyncopal spell.  2. Pacemaker upgrade this admission, to a BI-V ICD  3. Nonischemic cardiomyopathy, by catheterization July 2005.  4. Prior pacemaker implantation 1994.  5. History of hypertension with transient hypotension this admission.  6. History of major gastrointestinal bleed in the past.  7. History of paroxysmal atrial fibrillation.     HOSPITAL COURSE:  The patient is an 77 year old male with history of  nonischemic cardiomyopathy. 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 He has been followed by Dr. Alanda Amass as an  outpatient. He had a catheterization in July 2005 which showed nonischemic  cardiomyopathy. He is admitted March 03, 2005 with a near syncopal spell.  The patient has been followed as an outpatient and was noted to be near end-  of-life, with a battery expectancy of about six months. He was admitted to  the teaching service.  His pacemaker was interrogated; lower rate was cut  back to 60 and sensing turned off. He was seen by Dr. Launa Grill, and it was  decided to proceed with an elective generator change. Carotid Doppler's done  during this admission showed no significant internal carotid artery  stenosis. Ultimately the patient underwent implantation of a St. Jude BI-V  ICD. He is in DDI mode. He was transiently hypotensive at the time of his  pacemaker insertion; responded to fluids. He also had resting tachycardia  post pacemaker, and we gradually increased his beta-blocker. He should be  ready for discharge Mar 14, 2005.   DISCHARGE MEDICATIONS:  1. Lanoxin 0.25 mg q.d.  2. Coreg 6.25 mg b.i.d.  3. Lisinopril 10 mg q.d.  4. Coated aspirin q.d.  5. Proscar 5 mg q.d.  6. Neurontin 100 mg q.d.  7. Lasix 20 mg q.d.     LABS:  Sodium 134, potassium 3.9, BUN 17, creatinine 0.9.  White count 7.1,  hemoglobin 1.2, hematocrit 32.5, platelets 228.  INR is 0.9. Stool was  negative. Liver functions were normal. CK-MB and troponin's were negative.  TSH  0.8.  Iron levels 49, TIBC 260 percent saturation 19.   CT of his head showed no acute intracranial pathology.  On admission chest x-  ray on Mar 11, 2005 showed AICD pacer in place without complicating features.  EKG is V-paced atrial tracking.   DISPOSITION:  The patient will be discharged Mar 14, 2005.  He will have a  wound check in the office Mar 19, 2005 at 11:00 a.m. with Lezlie Octave.      LKK/MEDQ  D:  03/13/2005  T:  03/13/2005  Job:  [id number]

## 2011-03-27 NOTE — Procedures (Signed)
EEG:  [id number]   CLINICAL HISTORY:  77 year old gentleman who complained of weakness,  dizziness, and loss of consciousness.  Study is being done to look for the  presence of seizures.   CODE:  780.2.   PROCEDURE:  The tracing is carried out on a 32-channel digital Cadwell  recorder reformatted into 16-channel montages with one devoted to EKG.  The  patient was awake and alert.  The International 10/20 system lead placement  was used.  Medications include Neurontin, digoxin, Monopril, aspirin, Lasix.   DESCRIPTION OF FINDINGS:  Dominant frequency is a 20 microvolt 8 Hz activity  that is well regulated and attenuates little with eye opening.   Background activity shows a mixture of predominantly alpha and upper theta  range activity with frontally predominant under 10 microvolt beta range  activity.   Photic stimulation and hyperventilation caused no significant change in  background.  There was no interictal epileptiform activity in the form of  spikes or sharp waves.   EKG showed a regular sinus rhythm with ventricular response of 72 beats per  minute.   IMPRESSION:  Normal waking record.      Deanna Artis. Sharene Skeans, M.D.  Electronically Signed     GNF:[id number]  D:  03/11/2006 18:16:51  T:  03/12/2006 13:59:58  Job #:  [id number]   cc:   Casimiro Needle L. Thad Ranger, M.D.  Fax: [phone]   Dani Gobble, MD  Fax: [phone]

## 2011-06-18 ENCOUNTER — Other Ambulatory Visit: Payer: Self-pay | Admitting: Family Medicine

## 2011-06-18 DIAGNOSIS — M25572 Pain in left ankle and joints of left foot: Secondary | ICD-10-CM

## 2011-06-24 ENCOUNTER — Ambulatory Visit
Admission: RE | Admit: 2011-06-24 | Discharge: 2011-06-24 | Disposition: A | Discharge status: Home / Self Care | Payer: Medicare Other | Source: Ambulatory Visit | Attending: Family Medicine | Admitting: Family Medicine

## 2011-06-24 DIAGNOSIS — M25572 Pain in left ankle and joints of left foot: Secondary | ICD-10-CM

## 2012-01-10 ENCOUNTER — Other Ambulatory Visit: Payer: Self-pay | Admitting: Physician Assistant

## 2012-01-22 ENCOUNTER — Other Ambulatory Visit: Payer: Self-pay

## 2012-01-22 MED ORDER — GABAPENTIN 300 MG PO CAPS: 300.0000 mg | ORAL_CAPSULE | Freq: Three times a day (TID) | ORAL | Status: DC

## 2012-02-22 ENCOUNTER — Other Ambulatory Visit: Payer: Self-pay | Admitting: Physician Assistant

## 2012-02-23 ENCOUNTER — Other Ambulatory Visit: Payer: Self-pay | Admitting: Physician Assistant

## 2012-04-08 ENCOUNTER — Other Ambulatory Visit: Payer: Self-pay | Admitting: Physician Assistant

## 2012-04-12 ENCOUNTER — Ambulatory Visit (INDEPENDENT_AMBULATORY_CARE_PROVIDER_SITE_OTHER): Payer: Medicare Other | Admitting: Emergency Medicine

## 2012-04-12 ENCOUNTER — Encounter: Payer: Self-pay | Admitting: Emergency Medicine

## 2012-04-12 VITALS — BP 127/75 | HR 87 | Temp 98.1°F | Resp 16 | Ht 71.0 in | Wt 151.4 lb

## 2012-04-12 DIAGNOSIS — I509 Heart failure, unspecified: Secondary | ICD-10-CM

## 2012-04-12 DIAGNOSIS — D649 Anemia, unspecified: Secondary | ICD-10-CM

## 2012-04-12 DIAGNOSIS — F0390 Unspecified dementia without behavioral disturbance: Secondary | ICD-10-CM

## 2012-04-12 DIAGNOSIS — M353 Polymyalgia rheumatica: Secondary | ICD-10-CM

## 2012-04-12 NOTE — Progress Notes (Signed)
  Subjective:    Patient ID: Joshua Hester, male    DOB: 1917/08/06, 77 y.o.   MRN: [id number]  HPI patient enters for followup of his polymyalgia rheumatica. He's currently on prednisone 5 mg a day. He has not had a recent sedimentation rate . His dementia has become increasingly severe. He has seen Dr. Sandria Manly his neurologist and is currently on Aricept. His main problem is recent memory. He also has had intermittent nausea following EEG but otherwise has no specific he does pretty well with eating as long as he does not stray from his regular diet. He has an appointment to see his cardiologist on Thursday Dr. Alanda Amass to    Review of Systems     Objective:   Physical Exam looks pretty good in good shape. His weight is unchanged from a year ago his neck was supple his chest was clear there is a pacemaker in place cardiac exam is regular his abdomen is soft there is a large scar across the upper abdomen from previous but otherwise there are no masses palpable       Assessment & Plan:  He'll actually looks good today his major problem is worsening dementia related to his age of 24 his polymyalgia seems stable but we'll check a sedimentation rate today and see where we are with that . We'll need to forward all his blood tests to Dr. Alanda Amass

## 2012-04-13 LAB — CBC WITH DIFFERENTIAL/PLATELET
Basophils Absolute: 0 10*3/uL (ref 0.0–0.1)
Eosinophils Absolute: 0.2 10*3/uL (ref 0.0–0.7)
Eosinophils Relative: 2 % (ref 0–5)
HCT: 39.4 % (ref 39.0–52.0)
Lymphocytes Relative: 15 % (ref 12–46)
MCH: 29.4 pg (ref 26.0–34.0)
MCHC: 33.5 g/dL (ref 30.0–36.0)
MCV: 87.8 fL (ref 78.0–100.0)
Monocytes Absolute: 0.9 10*3/uL (ref 0.1–1.0)
RDW: 14.6 % (ref 11.5–15.5)
WBC: 10.3 10*3/uL (ref 4.0–10.5)

## 2012-04-13 LAB — COMPREHENSIVE METABOLIC PANEL
AST: 23 U/L (ref 0–37)
BUN: 22 mg/dL (ref 6–23)
Calcium: 9.6 mg/dL (ref 8.4–10.5)
Chloride: 104 mEq/L (ref 96–112)
Creat: 0.81 mg/dL (ref 0.50–1.35)
Total Bilirubin: 0.7 mg/dL (ref 0.3–1.2)

## 2012-04-13 LAB — SEDIMENTATION RATE: Sed Rate: 4 mm/hr (ref 0–16)

## 2012-04-20 ENCOUNTER — Encounter: Payer: Self-pay | Admitting: Emergency Medicine

## 2012-04-21 ENCOUNTER — Telehealth: Payer: Self-pay

## 2012-04-21 DIAGNOSIS — Z79899 Other long term (current) drug therapy: Secondary | ICD-10-CM

## 2012-04-21 NOTE — Telephone Encounter (Signed)
Miley can. come by for a repeat blood test. They could have made a mistake in the lab. We can only tell by having a repeat test done be sure you talk to his wife Joshua Hester about this.

## 2012-04-21 NOTE — Telephone Encounter (Signed)
SAYS PT IS ON DIGOXIN AND THAT WHEN HIS BLOOD WORK CAME BACK THAT MEDICINE DID NOT SHOW UP AT ALL.  HIS OTHER DOCTOR IS CONCERNED ABOUT THIS AND HIS WIFE WANTS DR DAUB TO LOOK BACK OVER THE BLOOD WORK

## 2012-04-22 NOTE — Telephone Encounter (Signed)
CAN WE PUT IN THE ORDER FOR HIS BLOODWORK.  HE WILL BE BY TOM.  CAN CLOSE AFTER ORDER IS IN [facility]

## 2012-04-22 NOTE — Telephone Encounter (Signed)
Order put in, can come in until 04/29/12 so should be in to have blood drawn before then.

## 2012-04-22 NOTE — Telephone Encounter (Signed)
LMOM for Joshua Hester to CB.

## 2012-04-24 ENCOUNTER — Ambulatory Visit (INDEPENDENT_AMBULATORY_CARE_PROVIDER_SITE_OTHER): Payer: Medicare Other | Admitting: Emergency Medicine

## 2012-04-24 DIAGNOSIS — Z79899 Other long term (current) drug therapy: Secondary | ICD-10-CM

## 2012-04-24 LAB — DIGOXIN LEVEL: Digoxin Level: 0.5 ng/mL — ABNORMAL LOW (ref 0.8–2.0)

## 2012-04-24 NOTE — Progress Notes (Signed)
  Subjective:    Patient ID: Joshua Hester, male    DOB: 05-Jun-1917, 78 y.o.   MRN: [id number]  HPI to have a repeat blood work done. Patient had labs done at 104 and his dig level which was checked was 0. According to the wife he has been taking the medication.    Review of Systems     Objective:   Physical Exam Not examined today the       Assessment & Plan:  Check digoxin level

## 2012-05-17 ENCOUNTER — Other Ambulatory Visit: Payer: Self-pay | Admitting: Physician Assistant

## 2012-05-18 ENCOUNTER — Other Ambulatory Visit: Payer: Self-pay | Admitting: *Deleted

## 2012-05-18 MED ORDER — PREDNISONE 5 MG PO TABS: 5.0000 mg | ORAL_TABLET | Freq: Every day | ORAL | Status: AC

## 2012-05-23 ENCOUNTER — Other Ambulatory Visit: Payer: Self-pay | Admitting: Physician Assistant

## 2012-05-28 ENCOUNTER — Telehealth: Payer: Self-pay

## 2012-05-28 NOTE — Telephone Encounter (Signed)
Spoke with [facility] and they have rx ready (was faxed 7/15)--patients wife notified.

## 2012-05-28 NOTE — Telephone Encounter (Signed)
Pt's wife called stated pt needs prescription for Ativan refilled. Pharmacy- [facility]. has sent request  With no response. Please call pt to let know when filled.  [phone]

## 2012-06-26 ENCOUNTER — Other Ambulatory Visit: Payer: Self-pay | Admitting: Physician Assistant

## 2012-07-07 ENCOUNTER — Other Ambulatory Visit: Payer: Self-pay | Admitting: Physician Assistant

## 2012-07-09 ENCOUNTER — Telehealth: Payer: Self-pay

## 2012-07-09 NOTE — Telephone Encounter (Signed)
Was sent in on 8/21 to [facility]. I have called to advise. I also called to pharmacy in case they did not get it.

## 2012-07-09 NOTE — Telephone Encounter (Signed)
° °  pt is trying to get a refill on on ativan wife is stating that they have been trying to get this for 10 days and he has enough for today and tomorrow  [facility] [address]  Best number [phone]

## 2012-07-13 ENCOUNTER — Other Ambulatory Visit: Payer: Self-pay

## 2012-07-13 MED ORDER — LORAZEPAM 1 MG PO TABS: ORAL_TABLET | ORAL | Status: DC

## 2012-07-17 ENCOUNTER — Ambulatory Visit (INDEPENDENT_AMBULATORY_CARE_PROVIDER_SITE_OTHER): Payer: Medicare Other | Admitting: Family Medicine

## 2012-07-17 VITALS — BP 114/73 | HR 82 | Temp 98.2°F | Resp 16 | Ht 71.0 in | Wt 155.0 lb

## 2012-07-17 DIAGNOSIS — Z23 Encounter for immunization: Secondary | ICD-10-CM

## 2012-08-11 ENCOUNTER — Other Ambulatory Visit: Payer: Self-pay | Admitting: Physician Assistant

## 2012-09-11 ENCOUNTER — Other Ambulatory Visit: Payer: Self-pay | Admitting: Physician Assistant

## 2012-10-03 ENCOUNTER — Other Ambulatory Visit: Payer: Self-pay | Admitting: Physician Assistant

## 2012-10-29 ENCOUNTER — Other Ambulatory Visit: Payer: Self-pay | Admitting: Physician Assistant

## 2012-11-28 ENCOUNTER — Ambulatory Visit (INDEPENDENT_AMBULATORY_CARE_PROVIDER_SITE_OTHER): Payer: Medicare Other | Admitting: Emergency Medicine

## 2012-11-28 ENCOUNTER — Encounter: Payer: Self-pay | Admitting: Emergency Medicine

## 2012-11-28 ENCOUNTER — Ambulatory Visit: Payer: Medicare Other

## 2012-11-28 VITALS — BP 119/69 | HR 82 | Temp 97.5°F | Resp 17 | Ht 71.0 in | Wt 141.2 lb

## 2012-11-28 DIAGNOSIS — I509 Heart failure, unspecified: Secondary | ICD-10-CM | POA: Insufficient documentation

## 2012-11-28 DIAGNOSIS — R634 Abnormal weight loss: Secondary | ICD-10-CM

## 2012-11-28 DIAGNOSIS — M353 Polymyalgia rheumatica: Secondary | ICD-10-CM | POA: Insufficient documentation

## 2012-11-28 DIAGNOSIS — I10 Essential (primary) hypertension: Secondary | ICD-10-CM

## 2012-11-28 DIAGNOSIS — F0390 Unspecified dementia without behavioral disturbance: Secondary | ICD-10-CM | POA: Insufficient documentation

## 2012-11-28 DIAGNOSIS — Z5181 Encounter for therapeutic drug level monitoring: Secondary | ICD-10-CM

## 2012-11-28 DIAGNOSIS — E119 Type 2 diabetes mellitus without complications: Secondary | ICD-10-CM

## 2012-11-28 LAB — CBC WITH DIFFERENTIAL/PLATELET
Basophils Absolute: 0 10*3/uL (ref 0.0–0.1)
Basophils Relative: 0 % (ref 0–1)
Eosinophils Relative: 1 % (ref 0–5)
Lymphocytes Relative: 11 % — ABNORMAL LOW (ref 12–46)
MCHC: 33.8 g/dL (ref 30.0–36.0)
MCV: 87.4 fL (ref 78.0–100.0)
Platelets: 240 10*3/uL (ref 150–400)
RDW: 14.6 % (ref 11.5–15.5)
WBC: 8.6 10*3/uL (ref 4.0–10.5)

## 2012-11-28 LAB — COMPREHENSIVE METABOLIC PANEL
ALT: 30 U/L (ref 0–53)
AST: 27 U/L (ref 0–37)
Alkaline Phosphatase: 65 U/L (ref 39–117)
BUN: 20 mg/dL (ref 6–23)
Calcium: 9.3 mg/dL (ref 8.4–10.5)
Chloride: 103 mEq/L (ref 96–112)
Creat: 0.8 mg/dL (ref 0.50–1.35)
Total Bilirubin: 0.7 mg/dL (ref 0.3–1.2)

## 2012-11-28 NOTE — Progress Notes (Signed)
  Subjective:    Patient ID: Joshua Hester, male    DOB: 10-23-1917, 79 y.o.   MRN: [id number]  HPI patient enters because he has had significant worsening in his dementia. He now is having difficulty with his ADLs. He needs assistance with cleaning bathing and dressing. He has been losing weight. He eats very little. He history of polymyalgia rheumatica and is currently on prednisone 5 mg daily his sed rates have been stable recently. He is been on Aricept to help with dementia but has not had much improvement with that. He has an implantable defibrillator pacer he denies any chest pain abdominal pain or any other symptoms    Review of Systems     Objective:   Physical Exam physical exam reveals a somewhat disheveled but alert cooperative male who is not in distress. His chest was clear his cardiac exam is unremarkable there is a pacemaker defibrillator implanted. Abdomen is soft and nontender  [facility] reading (PRIMARY) by  Dr.Daksha Koone there no acute changes on his chest x-ray       Assessment & Plan:     Referral is made for home health to get some assistance at home. He was given a prescription for a adjustable walker routine labs were also done .

## 2012-12-03 ENCOUNTER — Other Ambulatory Visit: Payer: Self-pay | Admitting: Physician Assistant

## 2012-12-08 ENCOUNTER — Telehealth: Payer: Self-pay

## 2012-12-08 DIAGNOSIS — M353 Polymyalgia rheumatica: Secondary | ICD-10-CM

## 2012-12-08 NOTE — Telephone Encounter (Signed)
April, [facility] RN called and stated that we had given pt's wife a Rx for the pt to get a walker, but she needs that to be faxed to [facility] at [phone]. Can we write another Rx for his walker please?

## 2012-12-27 ENCOUNTER — Other Ambulatory Visit: Payer: Self-pay | Admitting: Physician Assistant

## 2013-01-25 ENCOUNTER — Other Ambulatory Visit: Payer: Self-pay | Admitting: Physician Assistant

## 2013-01-31 ENCOUNTER — Ambulatory Visit (INDEPENDENT_AMBULATORY_CARE_PROVIDER_SITE_OTHER): Payer: Medicare Other | Admitting: Emergency Medicine

## 2013-01-31 VITALS — BP 125/67 | HR 78 | Temp 97.8°F | Resp 16 | Ht 70.0 in | Wt 139.0 lb

## 2013-01-31 DIAGNOSIS — R7309 Other abnormal glucose: Secondary | ICD-10-CM

## 2013-01-31 DIAGNOSIS — M353 Polymyalgia rheumatica: Secondary | ICD-10-CM

## 2013-01-31 DIAGNOSIS — F0390 Unspecified dementia without behavioral disturbance: Secondary | ICD-10-CM

## 2013-01-31 NOTE — Progress Notes (Signed)
  Subjective:    Patient ID: MCCRAE SPECIALE, male    DOB: 01/25/1917, 79 y.o.   MRN: [id number]  HPI patient here for general checkup his polymyalgia is under good control. His last 2 sed rates have been normal he's currently on prednisone 5 a day.    Review of Systems     Objective:   Physical Exam Annette Stable is more alert and conversant today. His chest was clear. Heart was regular rate without murmur        Assessment & Plan:  Would decrease his prednisone by 5 mg a week. When I see him in 3 months we'll check a sedimentation rate and continue to decrease his prednisone dosage.

## 2013-02-01 ENCOUNTER — Other Ambulatory Visit: Payer: Self-pay | Admitting: Emergency Medicine

## 2013-02-02 ENCOUNTER — Other Ambulatory Visit: Payer: Self-pay | Admitting: Radiology

## 2013-02-02 NOTE — Telephone Encounter (Signed)
Faxed to [facility]. [employer]

## 2013-02-22 ENCOUNTER — Other Ambulatory Visit: Payer: Self-pay | Admitting: Physician Assistant

## 2013-04-04 ENCOUNTER — Other Ambulatory Visit: Payer: Self-pay | Admitting: Emergency Medicine

## 2013-04-18 ENCOUNTER — Other Ambulatory Visit: Payer: Self-pay | Admitting: Physician Assistant

## 2013-04-27 ENCOUNTER — Other Ambulatory Visit: Payer: Self-pay | Admitting: Emergency Medicine

## 2013-04-28 ENCOUNTER — Encounter (HOSPITAL_COMMUNITY): Payer: Self-pay | Admitting: Emergency Medicine

## 2013-04-28 ENCOUNTER — Emergency Department (HOSPITAL_COMMUNITY)
Admission: EM | Admit: 2013-04-28 | Discharge: 2013-04-28 | Disposition: A | Discharge status: Home / Self Care | Payer: Medicare Other | Attending: Emergency Medicine | Admitting: Emergency Medicine

## 2013-04-28 DIAGNOSIS — Z87891 Personal history of nicotine dependence: Secondary | ICD-10-CM | POA: Insufficient documentation

## 2013-04-28 DIAGNOSIS — F0390 Unspecified dementia without behavioral disturbance: Secondary | ICD-10-CM | POA: Insufficient documentation

## 2013-04-28 DIAGNOSIS — Z7982 Long term (current) use of aspirin: Secondary | ICD-10-CM | POA: Insufficient documentation

## 2013-04-28 DIAGNOSIS — Z950 Presence of cardiac pacemaker: Secondary | ICD-10-CM | POA: Insufficient documentation

## 2013-04-28 DIAGNOSIS — R350 Frequency of micturition: Secondary | ICD-10-CM | POA: Insufficient documentation

## 2013-04-28 DIAGNOSIS — Z880 Allergy status to penicillin: Secondary | ICD-10-CM | POA: Insufficient documentation

## 2013-04-28 DIAGNOSIS — Z79899 Other long term (current) drug therapy: Secondary | ICD-10-CM | POA: Insufficient documentation

## 2013-04-28 DIAGNOSIS — H9190 Unspecified hearing loss, unspecified ear: Secondary | ICD-10-CM | POA: Insufficient documentation

## 2013-04-28 DIAGNOSIS — Z8679 Personal history of other diseases of the circulatory system: Secondary | ICD-10-CM | POA: Insufficient documentation

## 2013-04-28 DIAGNOSIS — H548 Legal blindness, as defined in USA: Secondary | ICD-10-CM | POA: Insufficient documentation

## 2013-04-28 DIAGNOSIS — N390 Urinary tract infection, site not specified: Secondary | ICD-10-CM | POA: Insufficient documentation

## 2013-04-28 DIAGNOSIS — Z7902 Long term (current) use of antithrombotics/antiplatelets: Secondary | ICD-10-CM | POA: Insufficient documentation

## 2013-04-28 HISTORY — DX: Cardiomyopathy, unspecified: I42.9

## 2013-04-28 HISTORY — DX: Unspecified hearing loss, unspecified ear: H91.90

## 2013-04-28 HISTORY — DX: Unspecified dementia, unspecified severity, without behavioral disturbance, psychotic disturbance, mood disturbance, and anxiety: F03.90

## 2013-04-28 HISTORY — DX: Legal blindness, as defined in USA: H54.8

## 2013-04-28 LAB — CBC
MCH: 29.7 pg (ref 26.0–34.0)
MCHC: 33.5 g/dL (ref 30.0–36.0)
Platelets: 239 10*3/uL (ref 150–400)

## 2013-04-28 LAB — COMPREHENSIVE METABOLIC PANEL
ALT: 15 U/L (ref 0–53)
AST: 20 U/L (ref 0–37)
Calcium: 8.8 mg/dL (ref 8.4–10.5)
GFR calc Af Amer: >90 mL/min (ref >90)
Glucose, Bld: 185 mg/dL — ABNORMAL HIGH (ref 70–99)
Sodium: 138 mEq/L (ref 135–145)
Total Protein: 6 g/dL (ref 6.0–8.3)

## 2013-04-28 LAB — URINE MICROSCOPIC-ADD ON

## 2013-04-28 LAB — URINALYSIS, ROUTINE W REFLEX MICROSCOPIC
Ketones, ur: NEGATIVE mg/dL
Nitrite: NEGATIVE
Protein, ur: >300 mg/dL — AB
Urobilinogen, UA: 1 mg/dL (ref 0.0–1.0)

## 2013-04-28 MED ORDER — CIPROFLOXACIN HCL 500 MG PO TABS: 500.0000 mg | ORAL_TABLET | Freq: Two times a day (BID) | ORAL | Status: DC

## 2013-04-28 MED ORDER — SODIUM CHLORIDE 0.9 % IV BOLUS (SEPSIS)
1000.0000 mL | Freq: Once | INTRAVENOUS | Status: AC
  Administered 2013-04-28: 1000 mL via INTRAVENOUS

## 2013-04-28 MED ORDER — CIPROFLOXACIN IN D5W 400 MG/200ML IV SOLN
400.0000 mg | Freq: Once | INTRAVENOUS | Status: AC
  Administered 2013-04-28: 400 mg via INTRAVENOUS
  Filled 2013-04-28: qty 200

## 2013-04-28 NOTE — ED Provider Notes (Signed)
History     CSN: [id number]  Arrival date & time 04/28/13  1359   First MD Initiated Contact with Patient 04/28/13 1506      Chief Complaint  Patient presents with  . Altered Mental Status    (Consider location/radiation/quality/duration/timing/severity/associated sxs/prior treatment) HPI A LEVEL 5 CAVEAT PERTAINS DUE TO DEMENTIA.  Pt presents with family members- he lives at home and has hx of dementia.  He is normally oriented to person only- has been having increased confusion over the past several days.  Urinating more frequently and urine has strong odor.  Family states he has continued to drink liquids well.  No vomiting or diarrhea, no fever or rigors.    Past Medical History  Diagnosis Date  . Cardiomyopathy   . Legal blindness   . Dementia   . Hard of hearing     Past Surgical History  Procedure Laterality Date  . Pacemaker insertion      History reviewed. No pertinent family history.  History  Substance Use Topics  . Smoking status: Former Games developer  . Smokeless tobacco: Not on file  . Alcohol Use: No      Review of Systems UNABLE TO OBTAIN ROS DUE TO LEVEL 5 CAVEAT  Allergies  Morphine and related and Penicillins  Home Medications   Current Outpatient Rx  Name  Route  Sig  Dispense  Refill  . aspirin 81 MG tablet   Oral   Take 81 mg by mouth 2 (two) times daily.          . carvedilol (COREG) 6.25 MG tablet   Oral   Take 6.25 mg by mouth 2 (two) times daily with a meal.         . cholecalciferol (VITAMIN D) 1000 UNITS tablet   Oral   Take 1,000 Units by mouth daily.         . clopidogrel (PLAVIX) 75 MG tablet   Oral   Take 75 mg by mouth every evening.          . digoxin (LANOXIN) 0.125 MG tablet   Oral   Take 125 mcg by mouth every evening.          . finasteride (PROSCAR) 5 MG tablet   Oral   Take 5 mg by mouth every morning.          . gabapentin (NEURONTIN) 300 MG capsule      take 1 capsule by mouth three times a  day   90 capsule   1   . LORazepam (ATIVAN) 1 MG tablet      take 1 tablet by mouth every morning and AT NOON 1 tablet by mouth every evening   90 tablet   1   . midodrine (PROAMATINE) 2.5 MG tablet   Oral   Take 2.5 mg by mouth 2 (two) times daily.          . Multiple Vitamin (MULTIVITAMIN) tablet   Oral   Take 1 tablet by mouth every evening.          . predniSONE (DELTASONE) 5 MG tablet   Oral   Take 1 tablet (5 mg total) by mouth daily. NEED VISIT, LABS!   30 tablet   0   . simvastatin (ZOCOR) 20 MG tablet   Oral   Take 20 mg by mouth every evening.         . ciprofloxacin (CIPRO) 500 MG tablet   Oral   Take 1 tablet (500 mg  total) by mouth 2 (two) times daily.   14 tablet   0     BP 140/67  Pulse 79  Temp(Src) 96.9 F (36.1 C) (Oral)  Resp 18  SpO2 95% Vitals reviewed Physical Exam Physical Examination: General appearance - alert, well appearing, and in no distress Mental status - alert, oriented to person only- this is his baseline Eyes - pupils equal and reactive, no conjunctival injection, no scleral icterus Mouth - mucous membranes moist, pharynx normal without lesions Chest - clear to auscultation, no wheezes, rales or rhonchi, symmetric air entry Heart - normal rate, regular rhythm, normal S1, S2, no murmurs, rubs, clicks or gallops Abdomen - soft, nontender, nondistended, no masses or organomegaly, nabs Extremities - peripheral pulses normal, no pedal edema, no clubbing or cyanosis Skin - normal coloration and turgor, no rashes  ED Course  Procedures (including critical care time)  Labs Reviewed  URINALYSIS, ROUTINE W REFLEX MICROSCOPIC - Abnormal; Notable for the following:    Color, Urine RED (*)    APPearance TURBID (*)    Hgb urine dipstick LARGE (*)    Bilirubin Urine SMALL (*)    Protein, ur >300 (*)    Leukocytes, UA LARGE (*)    All other components within normal limits  CBC - Abnormal; Notable for the following:    RBC 3.81  (*)    Hemoglobin 11.3 (*)    HCT 33.7 (*)    All other components within normal limits  COMPREHENSIVE METABOLIC PANEL - Abnormal; Notable for the following:    Glucose, Bld 185 (*)    BUN 36 (*)    Albumin 2.9 (*)    GFR calc non Af Amer 78 (*)    All other components within normal limits  URINE MICROSCOPIC-ADD ON - Abnormal; Notable for the following:    Squamous Epithelial / LPF FEW (*)    Bacteria, UA MANY (*)    All other components within normal limits  URINE CULTURE   No results found.   1. Urinary tract infection       MDM  Pt with hx of dementia presenting with increased confusion and strong urine odor with frequent urination.  Urinalysis reveals UTI- urine culture sent.  Pt also received IV hydration in addition to abx.   Per chart review, no recent positive urine cultures to help direct therapy. Pt is PCN allergic, so started on cipro.   All results and plan for home abx d/w daughter at bedside.  Discharged with strict return precautions.  Daughter agreeable with plan.       Ethelda Chick, MD 04/28/13 [phone]

## 2013-04-28 NOTE — ED Notes (Addendum)
Per EMS. Pt from home, dementia, lives with family. Pt normally alert oriented 1x, but family states pt has been having increased confusion for past week. Pt normally continent, but now incontinent. Strong urine odor noted. Family taking BP at home using wrist BP cuff, and have noticed decreasing BPs. Pt was orthostatic with EMS.  BP 98/62 with EMS, HR 68

## 2013-04-28 NOTE — Progress Notes (Signed)
Pt confirms pcp is steven daub EPIC updated

## 2013-04-28 NOTE — ED Notes (Signed)
[id number]<AV> Expected date:<BR> Expected time:<BR> Means of arrival:<BR> Comments:<BR> Ems/ hypotension

## 2013-04-28 NOTE — Telephone Encounter (Signed)
Due for labs June 2014 per last note

## 2013-04-30 LAB — URINE CULTURE

## 2013-05-02 ENCOUNTER — Telehealth: Payer: Self-pay

## 2013-05-02 ENCOUNTER — Encounter: Payer: Self-pay | Admitting: Emergency Medicine

## 2013-05-02 ENCOUNTER — Ambulatory Visit (INDEPENDENT_AMBULATORY_CARE_PROVIDER_SITE_OTHER): Payer: Medicare Other | Admitting: Emergency Medicine

## 2013-05-02 VITALS — BP 104/66 | HR 90 | Temp 97.9°F | Resp 20 | Ht 71.0 in | Wt 123.8 lb

## 2013-05-02 DIAGNOSIS — R634 Abnormal weight loss: Secondary | ICD-10-CM

## 2013-05-02 DIAGNOSIS — F0280 Dementia in other diseases classified elsewhere without behavioral disturbance: Secondary | ICD-10-CM

## 2013-05-02 NOTE — Telephone Encounter (Signed)
Daub  -  Vick Frees, a nurse with [facility], called saying she needs either the most recent notes or a FL2 form filled out for the patient so that they can get him in there.  You can reach her at [phone].   Fax # - [phone]

## 2013-05-02 NOTE — Telephone Encounter (Signed)
Please have the family go by the nursing home and pick up an FL2  form and I will be happy to complete it.

## 2013-05-02 NOTE — Telephone Encounter (Signed)
Do you want them to send the FL2?

## 2013-05-02 NOTE — Progress Notes (Signed)
  Subjective:    Patient ID: Joshua Hester, male    DOB: [birth year], 79 y.o.   MRN: [id number]  HPI patient continues to make a steady decline in his health. He was last seen in the emergency room diagnosed with urinary tract infection currently on Cipro. Culture grew multiple organisms nonspecific. He continues eat very poorly his weight continues to drop he has confusion off and on. He needs help with all of his ADLs    Review of Systems     Objective:   Physical Exam patient is very disheveled appearing. He seems cooperative and happy and in no distress. He is seated in a wheelchair. He has an implanted defibrillator. His lungs are clear his heart is a regular rate without murmurs        Assessment & Plan:  Patient facing end-of-life issues. I talked with him frankly about these. He does have a DO NOT RESUSCITATE order. I gave his wife Fannie Knee the numbers of different care facilities where they could possibly take over his care. I made an emergent referral to hospice to see if they can intervene now and help with the home situation.

## 2013-05-02 NOTE — Telephone Encounter (Signed)
Asked her to fax me the form.

## 2013-05-03 NOTE — Telephone Encounter (Signed)
Form being faxed, Dr Cleta Alberts now indicates to disregard the fax, he is going to have hospice come instead.

## 2013-05-09 ENCOUNTER — Telehealth: Payer: Self-pay

## 2013-05-09 NOTE — Telephone Encounter (Signed)
Please call Suydam d I spoke with Dr. Kandis Cocking office. He is going to call me on Thursday when he is back in the office and I will be in contact with her at that time. Please notify Fannie Knee on her cell phone which is [phone]

## 2013-05-09 NOTE — Telephone Encounter (Signed)
Fannie Knee called - Hospice MD requesting an order from Dr Cleta Alberts to disable the defibrillator.  [phone]  [place] Brick phone

## 2013-05-09 NOTE — Telephone Encounter (Signed)
Called and spoke with Joshua Hester. We'll contact Dr. Alanda Amass regarding protocol for defibrillator disconnect.

## 2013-05-10 NOTE — Telephone Encounter (Signed)
This should read please call Fannie Knee, which I did and she has indicated she will await the call from Dr Cleta Alberts after he speaks to Dr Alanda Amass. Lashawnda Hancox

## 2013-05-11 NOTE — Telephone Encounter (Signed)
Called Dr Alanda Amass, a representative from the company for the defib. Can go out and turn this off. We will need the order. The pacemaker will be left on but the defib. Disabled.

## 2013-05-16 ENCOUNTER — Telehealth: Payer: Self-pay | Admitting: Cardiovascular Disease

## 2013-05-16 NOTE — Telephone Encounter (Signed)
Dr Alanda Amass said he needed some kind of form from them-about turning off the defibrilator-not sure what he needs!

## 2013-05-17 ENCOUNTER — Other Ambulatory Visit: Payer: Self-pay

## 2013-05-17 NOTE — Telephone Encounter (Signed)
Form completed, Dr. Alanda Amass spoke with Joshua Hester whom is with [employer].

## 2013-05-23 LAB — ICD DEVICE OBSERVATION
BAMS-0001: 150 {beats}/min
BAMS-0003: 80 {beats}/min
BATTERY VOLTAGE: 2.56 V
CHARGE TIME: 14.5 s
HV IMPEDENCE: 48 Ohm
TZON-0003AFLUTTER: 400 ms
VF: 0

## 2013-05-26 ENCOUNTER — Encounter: Payer: Self-pay | Admitting: Cardiovascular Disease

## 2013-05-29 ENCOUNTER — Telehealth: Payer: Self-pay

## 2013-05-29 NOTE — Telephone Encounter (Signed)
Patients wife is calling about getting dr Cleta Alberts to call regarding sending a letter to [employer] [place] letting them know that he was not able to sign anything now please call sue at [phone]

## 2013-05-29 NOTE — Telephone Encounter (Signed)
Patients wife sue is calling to ask if dr Cleta Alberts can write

## 2013-06-01 ENCOUNTER — Other Ambulatory Visit: Payer: Self-pay | Admitting: Emergency Medicine

## 2013-06-02 ENCOUNTER — Telehealth: Payer: Self-pay | Admitting: Radiology

## 2013-06-02 NOTE — Telephone Encounter (Signed)
Letter mailed to wife

## 2013-06-11 ENCOUNTER — Emergency Department (HOSPITAL_COMMUNITY)

## 2013-06-11 ENCOUNTER — Inpatient Hospital Stay (HOSPITAL_COMMUNITY)
Admission: EM | Admit: 2013-06-11 | Discharge: 2013-06-15 | DRG: 689 | Disposition: A | Discharge status: Skilled Nursing Facility | Attending: Internal Medicine | Admitting: Internal Medicine

## 2013-06-11 ENCOUNTER — Encounter (HOSPITAL_COMMUNITY): Payer: Self-pay | Admitting: Nurse Practitioner

## 2013-06-11 DIAGNOSIS — F0280 Dementia in other diseases classified elsewhere without behavioral disturbance: Secondary | ICD-10-CM | POA: Diagnosis present

## 2013-06-11 DIAGNOSIS — I428 Other cardiomyopathies: Secondary | ICD-10-CM | POA: Diagnosis present

## 2013-06-11 DIAGNOSIS — H548 Legal blindness, as defined in USA: Secondary | ICD-10-CM | POA: Diagnosis present

## 2013-06-11 DIAGNOSIS — E43 Unspecified severe protein-calorie malnutrition: Secondary | ICD-10-CM | POA: Diagnosis present

## 2013-06-11 DIAGNOSIS — M353 Polymyalgia rheumatica: Secondary | ICD-10-CM | POA: Diagnosis present

## 2013-06-11 DIAGNOSIS — R4182 Altered mental status, unspecified: Secondary | ICD-10-CM

## 2013-06-11 DIAGNOSIS — G9340 Encephalopathy, unspecified: Secondary | ICD-10-CM

## 2013-06-11 DIAGNOSIS — H9190 Unspecified hearing loss, unspecified ear: Secondary | ICD-10-CM | POA: Diagnosis present

## 2013-06-11 DIAGNOSIS — F0281 Dementia in other diseases classified elsewhere with behavioral disturbance: Secondary | ICD-10-CM | POA: Diagnosis present

## 2013-06-11 DIAGNOSIS — F0391 Unspecified dementia with behavioral disturbance: Secondary | ICD-10-CM

## 2013-06-11 DIAGNOSIS — M25559 Pain in unspecified hip: Secondary | ICD-10-CM | POA: Diagnosis present

## 2013-06-11 DIAGNOSIS — IMO0002 Reserved for concepts with insufficient information to code with codable children: Secondary | ICD-10-CM

## 2013-06-11 DIAGNOSIS — I4891 Unspecified atrial fibrillation: Secondary | ICD-10-CM

## 2013-06-11 DIAGNOSIS — D649 Anemia, unspecified: Secondary | ICD-10-CM | POA: Diagnosis present

## 2013-06-11 DIAGNOSIS — G309 Alzheimer's disease, unspecified: Secondary | ICD-10-CM | POA: Diagnosis present

## 2013-06-11 DIAGNOSIS — Z95810 Presence of automatic (implantable) cardiac defibrillator: Secondary | ICD-10-CM

## 2013-06-11 DIAGNOSIS — Z681 Body mass index (BMI) 19 or less, adult: Secondary | ICD-10-CM

## 2013-06-11 DIAGNOSIS — R319 Hematuria, unspecified: Secondary | ICD-10-CM | POA: Diagnosis present

## 2013-06-11 DIAGNOSIS — Z87891 Personal history of nicotine dependence: Secondary | ICD-10-CM

## 2013-06-11 DIAGNOSIS — N390 Urinary tract infection, site not specified: Principal | ICD-10-CM

## 2013-06-11 DIAGNOSIS — Z66 Do not resuscitate: Secondary | ICD-10-CM | POA: Diagnosis present

## 2013-06-11 DIAGNOSIS — Z9181 History of falling: Secondary | ICD-10-CM

## 2013-06-11 DIAGNOSIS — F0390 Unspecified dementia without behavioral disturbance: Secondary | ICD-10-CM | POA: Diagnosis present

## 2013-06-11 DIAGNOSIS — Z781 Physical restraint status: Secondary | ICD-10-CM | POA: Diagnosis present

## 2013-06-11 DIAGNOSIS — F02818 Dementia in other diseases classified elsewhere, unspecified severity, with other behavioral disturbance: Secondary | ICD-10-CM | POA: Diagnosis present

## 2013-06-11 HISTORY — DX: Encounter for palliative care: Z51.5

## 2013-06-11 LAB — HEPATIC FUNCTION PANEL
Albumin: 2.8 g/dL — ABNORMAL LOW (ref 3.5–5.2)
Indirect Bilirubin: 0.4 mg/dL (ref 0.3–0.9)
Total Bilirubin: 0.6 mg/dL (ref 0.3–1.2)

## 2013-06-11 LAB — DIGOXIN LEVEL: Digoxin Level: 0.7 ng/mL — ABNORMAL LOW (ref 0.8–2.0)

## 2013-06-11 LAB — CBC WITH DIFFERENTIAL/PLATELET
Eosinophils Absolute: 0.1 10*3/uL (ref 0.0–0.7)
Eosinophils Relative: 2 % (ref 0–5)
HCT: 33.5 % — ABNORMAL LOW (ref 39.0–52.0)
Hemoglobin: 10.9 g/dL — ABNORMAL LOW (ref 13.0–17.0)
Lymphs Abs: 1.7 10*3/uL (ref 0.7–4.0)
MCH: 28.9 pg (ref 26.0–34.0)
MCV: 88.9 fL (ref 78.0–100.0)
Monocytes Absolute: 0.7 10*3/uL (ref 0.1–1.0)
Monocytes Relative: 8 % (ref 3–12)
RBC: 3.77 MIL/uL — ABNORMAL LOW (ref 4.22–5.81)

## 2013-06-11 LAB — URINALYSIS, ROUTINE W REFLEX MICROSCOPIC
Glucose, UA: NEGATIVE mg/dL
Protein, ur: >300 mg/dL — AB

## 2013-06-11 LAB — BASIC METABOLIC PANEL
CO2: 25 mEq/L (ref 19–32)
Calcium: 8.7 mg/dL (ref 8.4–10.5)
Glucose, Bld: 202 mg/dL — ABNORMAL HIGH (ref 70–99)
Sodium: 137 mEq/L (ref 135–145)

## 2013-06-11 LAB — URINE MICROSCOPIC-ADD ON

## 2013-06-11 LAB — AMMONIA: Ammonia: 18 umol/L (ref 11–60)

## 2013-06-11 MED ORDER — ONDANSETRON HCL 4 MG/2ML IJ SOLN: 4.0000 mg | Freq: Four times a day (QID) | INTRAMUSCULAR | Status: DC | PRN

## 2013-06-11 MED ORDER — ASPIRIN 81 MG PO CHEW
81.0000 mg | CHEWABLE_TABLET | Freq: Two times a day (BID) | ORAL | Status: DC
  Administered 2013-06-12 – 2013-06-13 (×5): 81 mg via ORAL
  Filled 2013-06-11 (×7): qty 1

## 2013-06-11 MED ORDER — ACETAMINOPHEN 650 MG RE SUPP: 650.0000 mg | Freq: Four times a day (QID) | RECTAL | Status: DC | PRN

## 2013-06-11 MED ORDER — ADULT MULTIVITAMIN W/MINERALS CH
1.0000 | ORAL_TABLET | Freq: Every evening | ORAL | Status: DC
  Administered 2013-06-12 – 2013-06-14 (×4): 1 via ORAL
  Filled 2013-06-11 (×5): qty 1

## 2013-06-11 MED ORDER — SIMVASTATIN 20 MG PO TABS
20.0000 mg | ORAL_TABLET | Freq: Every evening | ORAL | Status: DC
  Administered 2013-06-12 – 2013-06-14 (×4): 20 mg via ORAL
  Filled 2013-06-11 (×5): qty 1

## 2013-06-11 MED ORDER — CIPROFLOXACIN IN D5W 400 MG/200ML IV SOLN
400.0000 mg | Freq: Two times a day (BID) | INTRAVENOUS | Status: DC
  Administered 2013-06-12 – 2013-06-15 (×7): 400 mg via INTRAVENOUS
  Filled 2013-06-11 (×8): qty 200

## 2013-06-11 MED ORDER — SODIUM CHLORIDE 0.9 % IJ SOLN
3.0000 mL | Freq: Two times a day (BID) | INTRAMUSCULAR | Status: DC
  Administered 2013-06-11 – 2013-06-14 (×6): 3 mL via INTRAVENOUS

## 2013-06-11 MED ORDER — CARVEDILOL 6.25 MG PO TABS
6.2500 mg | ORAL_TABLET | Freq: Two times a day (BID) | ORAL | Status: DC
  Administered 2013-06-12 – 2013-06-15 (×7): 6.25 mg via ORAL
  Filled 2013-06-11 (×9): qty 1

## 2013-06-11 MED ORDER — ONDANSETRON HCL 4 MG PO TABS: 4.0000 mg | ORAL_TABLET | Freq: Four times a day (QID) | ORAL | Status: DC | PRN

## 2013-06-11 MED ORDER — VITAMIN D3 25 MCG (1000 UNIT) PO TABS
1000.0000 [IU] | ORAL_TABLET | Freq: Every day | ORAL | Status: DC
  Administered 2013-06-12 – 2013-06-15 (×4): 1000 [IU] via ORAL
  Filled 2013-06-11 (×4): qty 1

## 2013-06-11 MED ORDER — MIDODRINE HCL 2.5 MG PO TABS
2.5000 mg | ORAL_TABLET | Freq: Two times a day (BID) | ORAL | Status: DC
  Administered 2013-06-12 – 2013-06-15 (×8): 2.5 mg via ORAL
  Filled 2013-06-11 (×10): qty 1

## 2013-06-11 MED ORDER — FINASTERIDE 5 MG PO TABS
5.0000 mg | ORAL_TABLET | Freq: Every morning | ORAL | Status: DC
  Administered 2013-06-12 – 2013-06-15 (×4): 5 mg via ORAL
  Filled 2013-06-11 (×4): qty 1

## 2013-06-11 MED ORDER — CIPROFLOXACIN IN D5W 400 MG/200ML IV SOLN
400.0000 mg | Freq: Once | INTRAVENOUS | Status: AC
  Administered 2013-06-11: 400 mg via INTRAVENOUS
  Filled 2013-06-11: qty 200

## 2013-06-11 MED ORDER — SODIUM CHLORIDE 0.9 % IV BOLUS (SEPSIS)
1000.0000 mL | Freq: Once | INTRAVENOUS | Status: AC
  Administered 2013-06-11: 1000 mL via INTRAVENOUS

## 2013-06-11 MED ORDER — ENOXAPARIN SODIUM 40 MG/0.4ML ~~LOC~~ SOLN
40.0000 mg | Freq: Every day | SUBCUTANEOUS | Status: DC
  Administered 2013-06-12 – 2013-06-13 (×3): 40 mg via SUBCUTANEOUS
  Filled 2013-06-11 (×4): qty 0.4

## 2013-06-11 MED ORDER — DIGOXIN 125 MCG PO TABS
125.0000 ug | ORAL_TABLET | Freq: Every evening | ORAL | Status: DC
  Administered 2013-06-12 – 2013-06-14 (×4): 125 ug via ORAL
  Filled 2013-06-11 (×5): qty 1

## 2013-06-11 MED ORDER — ACETAMINOPHEN 325 MG PO TABS
650.0000 mg | ORAL_TABLET | Freq: Four times a day (QID) | ORAL | Status: DC | PRN
  Administered 2013-06-14 – 2013-06-15 (×2): 650 mg via ORAL
  Filled 2013-06-11 (×2): qty 2

## 2013-06-11 MED ORDER — PREDNISONE 5 MG PO TABS
5.0000 mg | ORAL_TABLET | Freq: Every day | ORAL | Status: DC
  Administered 2013-06-12 – 2013-06-15 (×4): 5 mg via ORAL
  Filled 2013-06-11 (×5): qty 1

## 2013-06-11 MED ORDER — LORAZEPAM 0.5 MG PO TABS
0.5000 mg | ORAL_TABLET | Freq: Two times a day (BID) | ORAL | Status: DC | PRN
  Administered 2013-06-13: 0.5 mg via ORAL
  Filled 2013-06-11: qty 1

## 2013-06-11 MED ORDER — GABAPENTIN 300 MG PO CAPS
300.0000 mg | ORAL_CAPSULE | Freq: Three times a day (TID) | ORAL | Status: DC
  Administered 2013-06-12 – 2013-06-15 (×11): 300 mg via ORAL
  Filled 2013-06-11 (×13): qty 1

## 2013-06-11 MED ORDER — CLOPIDOGREL BISULFATE 75 MG PO TABS
75.0000 mg | ORAL_TABLET | Freq: Every evening | ORAL | Status: DC
  Administered 2013-06-12 – 2013-06-13 (×3): 75 mg via ORAL
  Filled 2013-06-11 (×4): qty 1

## 2013-06-11 NOTE — ED Notes (Signed)
[id number]<AV> Expected date:<BR> Expected time:<BR> Means of arrival:<BR> Comments:<BR> UTI

## 2013-06-11 NOTE — ED Provider Notes (Signed)
CSN: [id number]     Arrival date & time 06/11/13  1812 History     First MD Initiated Contact with Patient 06/11/13 1813     Chief Complaint  Patient presents with  . Altered Mental Status  . Hip Pain    left    (Consider location/radiation/quality/duration/timing/severity/associated sxs/prior Treatment) Patient is a 79 y.o. male presenting with altered mental status and hip pain.  Altered Mental Status Hip Pain   Level 5 caveat due to dementia Pt brought by EMS from home who report the patient's daughters were at the house today and felt that the patient was more confused than baseline. He lives at home with his wife. He has history of dementia and urinary tract infection. Per EMS report the patient had low grade fever and strong smelling urine at home.    Past Medical History  Diagnosis Date  . Cardiomyopathy   . Legal blindness   . Dementia   . Hard of hearing    Past Surgical History  Procedure Laterality Date  . Pacemaker insertion     No family history on file. History  Substance Use Topics  . Smoking status: Former Games developer  . Smokeless tobacco: Not on file  . Alcohol Use: No    Review of Systems  Psychiatric/Behavioral: Positive for altered mental status.   Unable to assess due to mental status.   Allergies  Morphine and related and Penicillins  Home Medications   Current Outpatient Rx  Name  Route  Sig  Dispense  Refill  . aspirin 81 MG tablet   Oral   Take 81 mg by mouth 2 (two) times daily.          . carvedilol (COREG) 6.25 MG tablet   Oral   Take 6.25 mg by mouth 2 (two) times daily with a meal.         . cholecalciferol (VITAMIN D) 1000 UNITS tablet   Oral   Take 1,000 Units by mouth daily.         . ciprofloxacin (CIPRO) 500 MG tablet   Oral   Take 1 tablet (500 mg total) by mouth 2 (two) times daily.   14 tablet   0   . clopidogrel (PLAVIX) 75 MG tablet   Oral   Take 75 mg by mouth every evening.          . digoxin  (LANOXIN) 0.125 MG tablet   Oral   Take 125 mcg by mouth every evening.          . finasteride (PROSCAR) 5 MG tablet   Oral   Take 5 mg by mouth every morning.          . gabapentin (NEURONTIN) 300 MG capsule      take 1 capsule by mouth three times a day   90 capsule   1   . LORazepam (ATIVAN) 1 MG tablet      TAKE 1 TAB EACH MORNING, AT NOON, AND EACH EVENING.   90 tablet   1   . midodrine (PROAMATINE) 2.5 MG tablet   Oral   Take 2.5 mg by mouth 2 (two) times daily.          . Multiple Vitamin (MULTIVITAMIN) tablet   Oral   Take 1 tablet by mouth every evening.          . predniSONE (DELTASONE) 5 MG tablet   Oral   Take 1 tablet (5 mg total) by mouth daily. NEED VISIT, LABS!  30 tablet   0   . simvastatin (ZOCOR) 20 MG tablet   Oral   Take 20 mg by mouth every evening.          BP 113/59  Pulse 82  Temp(Src) 97.9 F (36.6 C) (Oral)  Resp 18  SpO2 93% Physical Exam  Nursing note and vitals reviewed. Constitutional: He is oriented to person, place, and time. He appears well-developed and well-nourished.  HENT:  Head: Normocephalic and atraumatic.  Membranes dry  Eyes: EOM are normal. Pupils are equal, round, and reactive to light.  Neck: Normal range of motion. Neck supple.  Cardiovascular: Normal rate, normal heart sounds and intact distal pulses.   Pulmonary/Chest: Effort normal and breath sounds normal.  Abdominal: Bowel sounds are normal. He exhibits no distension. There is no tenderness.  Musculoskeletal: Normal range of motion. He exhibits no edema and no tenderness.  Neurological: He is alert and oriented to person, place, and time. He has normal strength. No cranial nerve deficit or sensory deficit.  Skin: Skin is warm and dry. No rash noted.  Psychiatric: He has a normal mood and affect.    ED Course   Procedures (including critical care time)  Labs Reviewed  CBC WITH DIFFERENTIAL - Abnormal; Notable for the following:    RBC  3.77 (*)    Hemoglobin 10.9 (*)    HCT 33.5 (*)    All other components within normal limits  URINALYSIS, ROUTINE W REFLEX MICROSCOPIC - Abnormal; Notable for the following:    Color, Urine AMBER (*)    APPearance TURBID (*)    Specific Gravity, Urine 1.033 (*)    Hgb urine dipstick LARGE (*)    Bilirubin Urine SMALL (*)    Protein, ur >300 (*)    Leukocytes, UA LARGE (*)    All other components within normal limits  BASIC METABOLIC PANEL - Abnormal; Notable for the following:    Glucose, Bld 202 (*)    BUN 30 (*)    GFR calc non Af Amer 79 (*)    All other components within normal limits  URINE MICROSCOPIC-ADD ON - Abnormal; Notable for the following:    Bacteria, UA MANY (*)    All other components within normal limits  URINE CULTURE  TROPONIN I   Dg Chest 2 View  06/11/2013   *RADIOLOGY REPORT*  Clinical Data: Fever  CHEST - 2 VIEW  Comparison: 11/28/2012  Findings: The cardiac shadow is stable.  A defibrillator is again seen and stable.  The lungs are clear.  Some scarring is noted in the right lung base.  No sizable effusion is noted.  IMPRESSION: No acute abnormality noted.   Original Report Authenticated By: Alcide Clever, M.D.   Dg Hip Complete Left  06/11/2013   *RADIOLOGY REPORT*  Clinical Data: Left hip pain  LEFT HIP - COMPLETE 2+ VIEW  Comparison: None.  Findings: The pelvic ring is intact.  There is a area of cortical angulation identified the superior aspect of the femoral neck suspicious for a minimally displaced fracture.  No other focal abnormality is noted.  IMPRESSION: Changes suspicious for an undisplaced subcapital femoral neck fracture.  CT evaluation may be helpful   Original Report Authenticated By: Alcide Clever, M.D.   Ct Head Wo Contrast  06/11/2013   *RADIOLOGY REPORT*  Clinical Data: Confusion and recent head injury  CT HEAD WITHOUT CONTRAST  Technique:  Contiguous axial images were obtained from the base of the skull through the vertex without  contrast.   Comparison: 04/27/2010  Findings: The bony calvarium is intact.  Atrophic changes and chronic white matter ischemic changes are noted.  No findings to suggest acute hemorrhage, acute infarction or space-occupying mass lesion are noted.  IMPRESSION: Chronic changes without acute abnormality.   Original Report Authenticated By: Alcide Clever, M.D.   Ct Pelvis Wo Contrast  06/11/2013   *RADIOLOGY REPORT*  Clinical Data: Left hip pain  CT PELVIS WITHOUT CONTRAST  Technique:  Multidetector CT imaging of the pelvis was performed following the standard protocol without intravenous contrast.  Comparison: None.  Findings: The appendix is within normal limits.  The bladder is incompletely distended.  Some bladder wall thickening is noted of uncertain significance.  Diverticular change of the colon is seen.  Degenerative change of the lumbar spine is noted.  The area of abnormalities seen on recent plain film examination is not borne out on the CT examination.  No fractures are seen.  Mild degenerative changes of the hip joints are noted.  No other focal abnormality is seen.  IMPRESSION: No evidence of acute hip fracture.   Original Report Authenticated By: Alcide Clever, M.D.   1. Urinary tract infection   2. Altered mental status     MDM   Date: 06/11/2013  Rate: 81  Rhythm: AV Paced  QRS Axis: indeterminate  Intervals: indeterminate  ST/T Wave abnormalities: indeterminate  Conduction Disutrbances:paced  Narrative Interpretation:   Old EKG Reviewed: unchanged  Pt remains hemodynamically stable. Family at the bedside now also report he has had 2 falls recently. Hitting his head once earlier this week and injuring his L hip. Additional xrays were ordered as above, including CT to r/o occult hip fracture which was neg. Will discuss with Dr. Toniann Fail for admission to treat for UTI and AMS.      Charles B. Bernette Mayers, MD 06/12/13 1214

## 2013-06-11 NOTE — Progress Notes (Signed)
ANTIBIOTIC CONSULT NOTE - INITIAL  Pharmacy Consult for Cipro Indication: UTI  Allergies  Allergen Reactions  . Morphine And Related Other (See Comments)    Blood pressure drops   . Penicillins     REACTION: swelling    Patient Measurements: Height: 6' (182.9 cm) Weight: 115 lb (52.164 kg) IBW/kg (Calculated) : 77.6  Vital Signs: Temp: 97.6 F (36.4 C) (08/03 2241) Temp src: Oral (08/03 2241) BP: 134/78 mmHg (08/03 2241) Pulse Rate: 72 (08/03 2241) Intake/Output from previous day:   Intake/Output from this shift:    Labs:  Recent Labs  06/11/13 1902  WBC 8.1  HGB 10.9*  PLT 229  CREATININE 0.70   Estimated Creatinine Clearance: 42.6 ml/min (by C-G formula based on Cr of 0.7). No results found for this basename: VANCOTROUGH, VANCOPEAK, VANCORANDOM, GENTTROUGH, GENTPEAK, GENTRANDOM, TOBRATROUGH, TOBRAPEAK, TOBRARND, AMIKACINPEAK, AMIKACINTROU, AMIKACIN,  in the last 72 hours   Microbiology: No results found for this or any previous visit (from the past 720 hour(s)).  Medical History: Past Medical History  Diagnosis Date  . Cardiomyopathy   . Legal blindness   . Dementia   . Hard of hearing   . Hospice care patient     Medications:  Scheduled:  . aspirin  81 mg Oral BID  . [START ON 06/12/2013] carvedilol  6.25 mg Oral BID WC  . [START ON 06/12/2013] cholecalciferol  1,000 Units Oral Daily  . clopidogrel  75 mg Oral QPM  . digoxin  125 mcg Oral QPM  . enoxaparin (LOVENOX) injection  40 mg Subcutaneous QHS  . [START ON 06/12/2013] finasteride  5 mg Oral q morning - 10a  . gabapentin  300 mg Oral TID  . midodrine  2.5 mg Oral BID  . multivitamin with minerals  1 tablet Oral QPM  . [START ON 06/12/2013] predniSONE  5 mg Oral Q breakfast  . simvastatin  20 mg Oral QPM  . sodium chloride  3 mL Intravenous Q12H   Infusions:   Assessment:  79 yr old male presents with altered mental status and low grade fever at home.  Pt with 2 recent falls and complaint of  left hip pain.  CT shows no acute hip fracture  Cipro 400mg  IV x 1 given in ED @ 21:00  Patient to be admitted for altered mental status and UTI.  IV Cipro per pharmacy dosing to continue  CrCl ~ 43 ml/min  Urine culture ordered  Goal of Therapy:  Eradication of infection  Plan:   Cipro 400mg  IV q12h  Follow renal function, cultures/sensitivities  Demetre Monaco, Joselyn Glassman, PharmD 06/11/2013,10:58 PM

## 2013-06-11 NOTE — ED Notes (Signed)
MD at bedside. 

## 2013-06-11 NOTE — ED Notes (Signed)
Assisted patient with urinal at this time. Patient unable to void.

## 2013-06-11 NOTE — H&P (Addendum)
[facility] History and Physical  Joshua Hester:[id number] DOB: 06-22-1917 DOA: 06/11/2013  Referring physician: ER physician. PCP: Lucilla Edin, MD   History is obtained from ER physician and previous chart. Patient has dementia and does not provide history.  Chief Complaint: Altered mental status and hip pain.  HPI: Joshua Hester is a 79 y.o. male history of nonischemic cardiomyopathy status post AICD placement, if ablation, chronic anemia, hyperlipidemia, moderate aortic stenosis, polymyalgia rheumatica on steroids was brought to the ER patient was found to have confusion as noticed by patient's daughter. ER physician had discussed with the family. In addition patient was noticed to have frequent falls and had complained of left hip pain. Initial x-ray showed possibility of fracture but CT done showed no fracture. Labs revealed urine tract infection and at this time patient has been admitted for further management. Patient on my exam is distress and denies any pain. Follows commands.  Review of Systems: As presented in the history of presenting illness, rest negative.  Past Medical History  Diagnosis Date  . Cardiomyopathy   . Legal blindness   . Dementia   . Hard of hearing   . Hospice care patient    Past Surgical History  Procedure Laterality Date  . Pacemaker insertion     Social History:  reports that he has quit smoking. He does not have any smokeless tobacco history on file. He reports that he does not drink alcohol or use illicit drugs. Home. where does patient live-- Not sure. Can patient participate in ADLs?  Allergies  Allergen Reactions  . Morphine And Related Other (See Comments)    Blood pressure drops   . Penicillins     REACTION: swelling    History reviewed. No pertinent family history.    Prior to Admission medications   Medication Sig Start Date End Date Taking? Authorizing Provider  aspirin 81 MG tablet Take 81 mg by mouth 2 (two)  times daily.     Historical Provider, MD  carvedilol (COREG) 6.25 MG tablet Take 6.25 mg by mouth 2 (two) times daily with a meal.    Historical Provider, MD  cholecalciferol (VITAMIN D) 1000 UNITS tablet Take 1,000 Units by mouth daily.    Historical Provider, MD  ciprofloxacin (CIPRO) 500 MG tablet Take 1 tablet (500 mg total) by mouth 2 (two) times daily. 04/28/13   Joshua Chick, MD  clopidogrel (PLAVIX) 75 MG tablet Take 75 mg by mouth every evening.     Historical Provider, MD  digoxin (LANOXIN) 0.125 MG tablet Take 125 mcg by mouth every evening.     Historical Provider, MD  finasteride (PROSCAR) 5 MG tablet Take 5 mg by mouth every morning.     Historical Provider, MD  gabapentin (NEURONTIN) 300 MG capsule take 1 capsule by mouth three times a day 04/18/13   Joshua Pick, PA-C  LORazepam (ATIVAN) 1 MG tablet TAKE 1 TAB EACH MORNING, AT NOON, AND EACH EVENING. 06/01/13   Joshua Gobble, MD  midodrine (PROAMATINE) 2.5 MG tablet Take 2.5 mg by mouth 2 (two) times daily.     Historical Provider, MD  Multiple Vitamin (MULTIVITAMIN) tablet Take 1 tablet by mouth every evening.     Historical Provider, MD  predniSONE (DELTASONE) 5 MG tablet Take 1 tablet (5 mg total) by mouth daily. NEED VISIT, LABS! 04/27/13   Joshua Delia Chimes, PA-C  simvastatin (ZOCOR) 20 MG tablet Take 20 mg by mouth every evening.  Historical Provider, MD   Physical Exam: Filed Vitals:   06/11/13 1817 06/11/13 1828 06/11/13 2135  BP:  113/59 127/67  Pulse:  82 70  Temp:  97.9 F (36.6 C) 98.2 F (36.8 C)  TempSrc:  Oral Oral  Resp:  18 14  SpO2: 95% 93% 96%     General:  Well-developed poorly nourished.  Eyes: Anicteric no pallor.  ENT: No discharge from ears eyes nose mouth.  Neck: No mass felt.  Cardiovascular: S1-S2 heard.  Respiratory: No rhonchi or crepitations.  Abdomen: Soft nontender bowel sounds present.  Skin: No rash.  Musculoskeletal: No edema.  Psychiatric: Patient is oriented to his  name.  Neurologic: Follows commands and moves all extremities.  Labs on Admission:  Basic Metabolic Panel:  Recent Labs Lab 06/11/13 1902  NA 137  K 3.8  CL 104  CO2 25  GLUCOSE 202*  BUN 30*  CREATININE 0.70  CALCIUM 8.7   Liver Function Tests: No results found for this basename: AST, ALT, ALKPHOS, BILITOT, PROT, ALBUMIN,  in the last 168 hours No results found for this basename: LIPASE, AMYLASE,  in the last 168 hours No results found for this basename: AMMONIA,  in the last 168 hours CBC:  Recent Labs Lab 06/11/13 1902  WBC 8.1  NEUTROABS 5.6  HGB 10.9*  HCT 33.5*  MCV 88.9  PLT 229   Cardiac Enzymes:  Recent Labs Lab 06/11/13 1902  TROPONINI <0.30    BNP (last 3 results) No results found for this basename: PROBNP,  in the last 8760 hours CBG: No results found for this basename: GLUCAP,  in the last 168 hours  Radiological Exams on Admission: Dg Chest 2 View  06/11/2013   *RADIOLOGY REPORT*  Clinical Data: Fever  CHEST - 2 VIEW  Comparison: 11/28/2012  Findings: The cardiac shadow is stable.  A defibrillator is again seen and stable.  The lungs are clear.  Some scarring is noted in the right lung base.  No sizable effusion is noted.  IMPRESSION: No acute abnormality noted.   Original Report Authenticated By: Alcide Clever, M.D.   Dg Hip Complete Left  06/11/2013   *RADIOLOGY REPORT*  Clinical Data: Left hip pain  LEFT HIP - COMPLETE 2+ VIEW  Comparison: None.  Findings: The pelvic ring is intact.  There is a area of cortical angulation identified the superior aspect of the femoral neck suspicious for a minimally displaced fracture.  No other focal abnormality is noted.  IMPRESSION: Changes suspicious for an undisplaced subcapital femoral neck fracture.  CT evaluation may be helpful   Original Report Authenticated By: Alcide Clever, M.D.   Ct Head Wo Contrast  06/11/2013   *RADIOLOGY REPORT*  Clinical Data: Confusion and recent head injury  CT HEAD WITHOUT CONTRAST   Technique:  Contiguous axial images were obtained from the base of the skull through the vertex without contrast.  Comparison: 04/27/2010  Findings: The bony calvarium is intact.  Atrophic changes and chronic white matter ischemic changes are noted.  No findings to suggest acute hemorrhage, acute infarction or space-occupying mass lesion are noted.  IMPRESSION: Chronic changes without acute abnormality.   Original Report Authenticated By: Alcide Clever, M.D.   Ct Pelvis Wo Contrast  06/11/2013   *RADIOLOGY REPORT*  Clinical Data: Left hip pain  CT PELVIS WITHOUT CONTRAST  Technique:  Multidetector CT imaging of the pelvis was performed following the standard protocol without intravenous contrast.  Comparison: None.  Findings: The appendix is within normal limits.  The bladder is incompletely distended.  Some bladder wall thickening is noted of uncertain significance.  Diverticular change of the colon is seen.  Degenerative change of the lumbar spine is noted.  The area of abnormalities seen on recent plain film examination is not borne out on the CT examination.  No fractures are seen.  Mild degenerative changes of the hip joints are noted.  No other focal abnormality is seen.  IMPRESSION: No evidence of acute hip fracture.   Original Report Authenticated By: Alcide Clever, M.D.    EKG: Independently reviewed. Paced rhythm.  Assessment/Plan Principal Problem:   Acute encephalopathy Active Problems:   Dementia   Polymyalgia   UTI (lower urinary tract infection)   Atrial fibrillation   1. Acute encephalopathy - probably secondary to UTI. Patient is afebrile. In addition check digoxin levels and also ammonia levels. Continue with antibiotics for UTI. Check urine culture. 2. UTI - see #1. 3. Nonischemic cardiomyopathy - looks compensated. 4. Atrial fibrillation rate controlled - check digoxin levels. Patient not a candidate for Coumadin, per notes patient had GI bleed previously and now frequent  falls. 5. Hyperglycemia - check hemoglobin A1c. Patient on chronic steroids. 6. Polymyalgia rheumatica - continue steroids. 7. Dementia - see #1. 8. Chronic anemia - follow CBC.    Code Status: DO NOT RESUSCITATE(PCPs notes confirm this)  Family Communication: None. Tried to call family but was unable to reach. ER physician had discussed with the family while they were in the ER.  Disposition Plan: Admit to inpatient.    KAKRAKANDY,ARSHAD N. [facility] Pager [phone].  If 7PM-7AM, please contact night-coverage [email or web address] Password [email or web address] 06/11/2013, 10:20 PM

## 2013-06-11 NOTE — ED Notes (Signed)
Per EMS: Pt from home. Pt hot to touch, tympanic temp 99 degrees. Two daughters said he was acting kind of strange, today is the first time they've seen him in 3 wks. Wife provides care for pt at home. Pt had fall a few wks ago, and left hip is hurting. Pt seems confused, slow to respond. Oriented to person, place, and situation. Pt has ventricular pacemaker.

## 2013-06-12 ENCOUNTER — Encounter (HOSPITAL_COMMUNITY): Payer: Self-pay

## 2013-06-12 LAB — COMPREHENSIVE METABOLIC PANEL
AST: 26 U/L (ref 0–37)
Albumin: 2.6 g/dL — ABNORMAL LOW (ref 3.5–5.2)
Chloride: 103 mEq/L (ref 96–112)
Creatinine, Ser: 0.66 mg/dL (ref 0.50–1.35)
Potassium: 3.3 mEq/L — ABNORMAL LOW (ref 3.5–5.1)
Sodium: 137 mEq/L (ref 135–145)
Total Bilirubin: 0.6 mg/dL (ref 0.3–1.2)

## 2013-06-12 LAB — CBC
MCH: 28.9 pg (ref 26.0–34.0)
MCV: 88.8 fL (ref 78.0–100.0)
Platelets: 200 10*3/uL (ref 150–400)
RBC: 3.67 MIL/uL — ABNORMAL LOW (ref 4.22–5.81)

## 2013-06-12 NOTE — Progress Notes (Signed)
Patient declines activation of MyChart at this time 

## 2013-06-12 NOTE — Progress Notes (Signed)
[facility] PROGRESS NOTE  MERCURY ROCK AVW:[id number] DOB: 1916-11-17 DOA: 06/11/2013 PCP: Lucilla Edin, MD  Assessment/Plan: Acute Encephalopathy -Per family, improving. -Suspect related to UTI.  UTI -Continue cipro pending cx data,  Dementia -Continue home meds.  Atrial Fibrillation -Has been deemed a non-coumadin candidate in the past due to memory loss, GI bleed and frequent falls.  Polymyalgia Rheumatica -Continue home dose of steroids.  Code Status: DNR Family Communication: Son at bedside Joshua Needle) and wife Joshua Hester) via telephone.  Disposition Plan: Back home with hospice once ready.   Consultants:  None   Antibiotics:  Cipro   Subjective: No acute events. "I need to go home cause my roast beef is getting cold!".  Objective: Filed Vitals:   06/11/13 2135 06/11/13 2241 06/12/13 0601 06/12/13 1338  BP: 127/67 134/78 133/74 104/63  Pulse: 70 72 70 73  Temp: 98.2 F (36.8 C) 97.6 F (36.4 C) 97.6 F (36.4 C) 98.1 F (36.7 C)  TempSrc: Oral Oral Oral Oral  Resp: 14 16 16 18   Height:  6' (1.829 m)    Weight:  52.164 kg (115 lb) 52.617 kg (116 lb)   SpO2: 96% 95% 93% 95%    Intake/Output Summary (Last 24 hours) at 06/12/13 1415 Last data filed at 06/12/13 1338  Gross per 24 hour  Intake    120 ml  Output      0 ml  Net    120 ml   Filed Weights   06/11/13 2241 06/12/13 0601  Weight: 52.164 kg (115 lb) 52.617 kg (116 lb)    Exam:   General:  Awake, pleasant  Cardiovascular: RRR  Respiratory: CTAB  Abdomen: S/NT/ND/+BS  Extremities: no C/C/E   Neurologic:  Non-focal  Data Reviewed: Basic Metabolic Panel:  Recent Labs Lab 06/11/13 1902 06/12/13 0450  NA 137 137  K 3.8 3.3*  CL 104 103  CO2 25 27  GLUCOSE 202* 121*  BUN 30* 27*  CREATININE 0.70 0.66  CALCIUM 8.7 8.5   Liver Function Tests:  Recent Labs Lab 06/11/13 1902 06/12/13 0450  AST 36 26  ALT 18 17  ALKPHOS 56 53  BILITOT 0.6 0.6  PROT 5.5* 5.1*   ALBUMIN 2.8* 2.6*   No results found for this basename: LIPASE, AMYLASE,  in the last 168 hours  Recent Labs Lab 06/11/13 2254  AMMONIA 18   CBC:  Recent Labs Lab 06/11/13 1902 06/12/13 0450  WBC 8.1 8.0  NEUTROABS 5.6  --   HGB 10.9* 10.6*  HCT 33.5* 32.6*  MCV 88.9 88.8  PLT 229 200   Cardiac Enzymes:  Recent Labs Lab 06/11/13 1902  TROPONINI <0.30   BNP (last 3 results) No results found for this basename: PROBNP,  in the last 8760 hours CBG: No results found for this basename: GLUCAP,  in the last 168 hours  No results found for this or any previous visit (from the past 240 hour(s)).   Studies: Dg Chest 2 View  06/11/2013   *RADIOLOGY REPORT*  Clinical Data: Fever  CHEST - 2 VIEW  Comparison: 11/28/2012  Findings: The cardiac shadow is stable.  A defibrillator is again seen and stable.  The lungs are clear.  Some scarring is noted in the right lung base.  No sizable effusion is noted.  IMPRESSION: No acute abnormality noted.   Original Report Authenticated By: Alcide Clever, M.D.   Dg Hip Complete Left  06/11/2013   *RADIOLOGY REPORT*  Clinical Data: Left hip pain  LEFT  HIP - COMPLETE 2+ VIEW  Comparison: None.  Findings: The pelvic ring is intact.  There is a area of cortical angulation identified the superior aspect of the femoral neck suspicious for a minimally displaced fracture.  No other focal abnormality is noted.  IMPRESSION: Changes suspicious for an undisplaced subcapital femoral neck fracture.  CT evaluation may be helpful   Original Report Authenticated By: Alcide Clever, M.D.   Ct Head Wo Contrast  06/11/2013   *RADIOLOGY REPORT*  Clinical Data: Confusion and recent head injury  CT HEAD WITHOUT CONTRAST  Technique:  Contiguous axial images were obtained from the base of the skull through the vertex without contrast.  Comparison: 04/27/2010  Findings: The bony calvarium is intact.  Atrophic changes and chronic white matter ischemic changes are noted.  No findings  to suggest acute hemorrhage, acute infarction or space-occupying mass lesion are noted.  IMPRESSION: Chronic changes without acute abnormality.   Original Report Authenticated By: Alcide Clever, M.D.   Ct Pelvis Wo Contrast  06/11/2013   *RADIOLOGY REPORT*  Clinical Data: Left hip pain  CT PELVIS WITHOUT CONTRAST  Technique:  Multidetector CT imaging of the pelvis was performed following the standard protocol without intravenous contrast.  Comparison: None.  Findings: The appendix is within normal limits.  The bladder is incompletely distended.  Some bladder wall thickening is noted of uncertain significance.  Diverticular change of the colon is seen.  Degenerative change of the lumbar spine is noted.  The area of abnormalities seen on recent plain film examination is not borne out on the CT examination.  No fractures are seen.  Mild degenerative changes of the hip joints are noted.  No other focal abnormality is seen.  IMPRESSION: No evidence of acute hip fracture.   Original Report Authenticated By: Alcide Clever, M.D.    Scheduled Meds: . aspirin  81 mg Oral BID  . carvedilol  6.25 mg Oral BID WC  . cholecalciferol  1,000 Units Oral Daily  . ciprofloxacin  400 mg Intravenous Q12H  . clopidogrel  75 mg Oral QPM  . digoxin  125 mcg Oral QPM  . enoxaparin (LOVENOX) injection  40 mg Subcutaneous QHS  . finasteride  5 mg Oral q morning - 10a  . gabapentin  300 mg Oral TID  . midodrine  2.5 mg Oral BID  . multivitamin with minerals  1 tablet Oral QPM  . predniSONE  5 mg Oral Q breakfast  . simvastatin  20 mg Oral QPM  . sodium chloride  3 mL Intravenous Q12H   Continuous Infusions:   Principal Problem:   Acute encephalopathy Active Problems:   Dementia   Polymyalgia   UTI (lower urinary tract infection)   Atrial fibrillation    Time spent: 45 minutes    Joshua Hester,Joshua Hester  [facility] Pager [phone]  If 7PM-7AM, please contact night-coverage at [email or web address], password  [facility] 06/12/2013, 2:15 PM  LOS: 1 day

## 2013-06-12 NOTE — Progress Notes (Addendum)
Room 1512 - Jiles Harold - [facility] RN Visit-R.Tyneisha Hegeman RN  Related admission to [facility] diagnosis of Alzheimers.  Pt is DNR code.  Pt sleeping soundly and did not respond to verbal stimuli, lying in bed, appears comfortable.    No family present. Patient's home medication list is on shadow chart.  Will continue to follow and discuss with [facility] SW.   Per EPIC NOTE Per EMS: Pt from home. Pt hot to touch, tympanic temp 99 degrees. Two daughters said he was acting kind of strange, today is the first time they've seen him in 3 wks. Wife provides care for pt at home. Pt had fall a few wks ago, and left hip is hurting. Pt seems confused, slow to respond. Oriented to person, place, and situation. Pt has ventricular pacemaker.   Please call [facility] @ [phone]- ask for RN Liaison or after hours,ask for on-call RN with any hospice needs.   Thank you.  Joneen Boers, RN  [facility]  Hospice Liaison

## 2013-06-13 DIAGNOSIS — E43 Unspecified severe protein-calorie malnutrition: Secondary | ICD-10-CM | POA: Insufficient documentation

## 2013-06-13 LAB — BASIC METABOLIC PANEL
BUN: 21 mg/dL (ref 6–23)
CO2: 29 mEq/L (ref 19–32)
Chloride: 101 mEq/L (ref 96–112)
Creatinine, Ser: 0.68 mg/dL (ref 0.50–1.35)
Glucose, Bld: 118 mg/dL — ABNORMAL HIGH (ref 70–99)

## 2013-06-13 LAB — CBC
HCT: 34.1 % — ABNORMAL LOW (ref 39.0–52.0)
MCV: 88.8 fL (ref 78.0–100.0)
RBC: 3.84 MIL/uL — ABNORMAL LOW (ref 4.22–5.81)
RDW: 15.1 % (ref 11.5–15.5)
WBC: 9.4 10*3/uL (ref 4.0–10.5)

## 2013-06-13 LAB — URINE CULTURE: Colony Count: 70000

## 2013-06-13 MED ORDER — ENSURE COMPLETE PO LIQD
237.0000 mL | Freq: Three times a day (TID) | ORAL | Status: DC
  Administered 2013-06-13 – 2013-06-15 (×4): 237 mL via ORAL

## 2013-06-13 MED ORDER — HALOPERIDOL LACTATE 5 MG/ML IJ SOLN
2.5000 mg | Freq: Four times a day (QID) | INTRAMUSCULAR | Status: DC | PRN
  Administered 2013-06-13 – 2013-06-14 (×2): 2.5 mg via INTRAVENOUS
  Filled 2013-06-13 (×2): qty 1

## 2013-06-13 MED ORDER — LORAZEPAM 1 MG PO TABS
1.0000 mg | ORAL_TABLET | Freq: Once | ORAL | Status: AC
  Administered 2013-06-13: 1 mg via ORAL
  Filled 2013-06-13: qty 1

## 2013-06-13 NOTE — Progress Notes (Signed)
RM 1512  Joshua Hester                              [facility] MSW Note:   Spoke with pt's spouse via phone to discuss pt's revocation from hospice. She provided permission for her dtr in law Britta Mccreedy to make arrangements and sign revocation forms on her behalf. Met with pt's dtr Britta Mccreedy to discuss pt's revocation from hospice services in order for pt's medicare to cover rehab at a SNF. She signed forms on behalf of pt and pt's spouse. Pt's hospice medicare benefit will be revoked the day he enters the SNF.  Spoke with CSW [place] and hospice liaison Dot Lanes regarding all of the above.Althia Forts,  LCSW

## 2013-06-13 NOTE — Progress Notes (Signed)
Pt with increased confusion noted pt making freq attempts to get oob.  Bed alarm remains on and a safety sitter is in the hallway to watch the pt. Dr Ardyth Harps sent a text and informed.

## 2013-06-13 NOTE — Progress Notes (Addendum)
Clinical Social Work Department BRIEF PSYCHOSOCIAL ASSESSMENT 06/13/2013  Patient:  Joshua Hester, Joshua Hester     Account Number:  [id number]     Admit date:  06/11/2013  Clinical Social Worker:  Dennison Bulla  Date/Time:  06/13/2013 11:45 AM  Referred by:  Physician  Date Referred:  06/13/2013 Referred for  SNF Placement   Other Referral:   Interview type:  Family Other interview type:   [facility] [phone]    PSYCHOSOCIAL DATA Living Status:  FAMILY Admitted from facility:   Level of care:   Primary support name:  Joshua Hester Primary support relationship to patient:  CHILD, ADULT Degree of support available:   Strong    CURRENT CONCERNS Current Concerns  Post-Acute Placement   Other Concerns:    SOCIAL WORK ASSESSMENT / PLAN CSW received referral to assist with DC planning. CSW reviewed chart and spoke with hospice RN and SW. Per hospice, patient lives with wife but wife reported she would be interested in placement. Hospice reports that dtr Joshua Hester) is HCPOA but reports no paperwork stating matter. CSW called wife and left a message. CSW also spoke with dtr Joshua Hester). CSW asked for family to bring [facility] paperwork. Dtr reports that wife is overwhelmed and asked dtr to assist with decision making. Dtr aware that CSW needs to verify information with wife as well.    CSW spoke with dtr regarding placement. Dtr reports that wife feels that patient needs additional care and wants to revoke hospice so that patient can receive therapy. CSW explained SNF option and explained Medicare benefits. Dtr is aware of process and declined SNF list due to the fact that she is familiar with facilities. Dtr reports that family prefers [facility] or [facility]. Dtr reports that husband is a MD and has called [facility] in order to see if patient could be admitted there even though he is not a resident.    CSW explained that CSW would complete a search of these facilities and  keep family updated with plans. CSW awaits a call from wife but will confirm plans with her as well. Patient is unable to participate in assessment during this time.    CSW completed FL2 and faxed out. CSW will continue to follow.   Assessment/plan status:  Psychosocial Support/Ongoing Assessment of Needs Other assessment/ plan:   Information/referral to community resources:   SNF information    PATIENT'S/FAMILY'S RESPONSE TO PLAN OF CARE: Patient unable to participate in assessment. Patient's wife has not returned CSW call at this time. Hospice was helpful in providing information regarding patient's status prior to admission. Dtr was engaged throughout assessment and thanked CSW for assisting with matter. Dtr hopeful for SNF placement at one of their top three choices and has CSW contact information if needed.       Addendum: Wife called CSW and reports that she is not feeling well and gives Joshua Hester permission to make all decisions regarding DC planning for patient. Wife aware of SNF placement and agreeable to plan.

## 2013-06-13 NOTE — Progress Notes (Addendum)
Clinical Social Work Department CLINICAL SOCIAL WORK PLACEMENT NOTE 06/13/2013  Patient:  Joshua Hester, Joshua Hester  Account Number:  [id number] Admit date:  06/11/2013  Clinical Social Worker:  Unk Lightning, LCSW  Date/time:  06/13/2013 11:45 AM  Clinical Social Work is seeking post-discharge placement for this patient at the following level of care:   SKILLED NURSING   (*CSW will update this form in Epic as items are completed)    Declined Patient/family provided with [facility] Department of Clinical Social Work's list of facilities offering this level of care within the geographic area requested by the patient (or if unable, by the patient's family).  06/13/2013  Patient/family informed of their freedom to choose among providers that offer the needed level of care, that participate in Medicare, Medicaid or managed care program needed by the patient, have an available bed and are willing to accept the patient.  06/13/2013  Patient/family informed of [facility]' ownership interest in [facility], as well as of the fact that they are under no obligation to receive care at this facility.  PASARR submitted to EDS on 06/13/2013 PASARR number received from EDS on 06/13/2013  FL2 transmitted to all facilities in geographic area requested by pt/family on  06/13/2013 FL2 transmitted to all facilities within larger geographic area on   Patient informed that his/her managed care company has contracts with or will negotiate with  certain facilities, including the following:     Patient/family informed of bed offers received:  06/14/13 Patient chooses bed at [facility] Physician recommends and patient [facility] bed [facility]    [facility]13 Patient to be transferred to facility by [facility]  The following physician request were entered in Epic:   Additional Comments:

## 2013-06-13 NOTE — Progress Notes (Signed)
Room 1512 - Joshua Hester - [facility] RN Visit-R.Rocco Kerkhoff RN  Related admission to [facility] diagnosis of Alzheimer's.  Pt is DNR code.    Pt lying in bed, not responsive to touch or verbal stimuli, appears comfortable.  Discussed with staff RN, pt awoke this am to take meds, reports agitated last evening - ? Sundowning per RN.  No family present.   Patient's home medication list is on shadow chart.   Per notes in chart, wife stated at admission she could no longer care for pt at home.  Discussed with staff SW [place] who will contact family today.  [facility] SW [place] aware of possible placement.  Please call [facility] @ [phone]- ask for RN Liaison or after hours,ask for on-call RN with any hospice needs.   Thank you.  Joneen Boers, RN  [facility]  Hospice Liaison

## 2013-06-13 NOTE — Progress Notes (Signed)
[facility] PROGRESS NOTE  Joshua Hester:[id number] DOB: 20-Nov-1916 DOA: 06/11/2013 PCP: Lucilla Edin, MD  Assessment/Plan: Acute Encephalopathy -Per family, improving. -Suspect related to UTI.  UTI -Cx with multiple morphotypes. -Given his improvement, will elect to continue abx for 7 days total.  Dementia -Continue home meds.  Atrial Fibrillation -Has been deemed a non-coumadin candidate in the past due to memory loss, GI bleed and frequent falls.  Polymyalgia Rheumatica -Continue home dose of steroids.  Code Status: DNR Family Communication: Son at bedside Casimiro Needle) and daughter Britta Mccreedy) via telephone. Disposition Plan: SNF in am.   Consultants:  None   Antibiotics:  Cipro   Subjective: No acute events. Was a little confused and agitated last pm.  Objective: Filed Vitals:   06/12/13 1338 06/12/13 2120 06/13/13 0511 06/13/13 1312  BP: 104/63 128/71 160/83 116/65  Pulse: 73 73 77 73  Temp: 98.1 F (36.7 C) 97.8 F (36.6 C) 97.8 F (36.6 C) 97.7 F (36.5 C)  TempSrc: Oral Oral Oral Oral  Resp: 18 18 18 16   Height:      Weight:      SpO2: 95% 97% 96% 96%    Intake/Output Summary (Last 24 hours) at 06/13/13 1432 Last data filed at 06/13/13 1400  Gross per 24 hour  Intake   1080 ml  Output    350 ml  Net    730 ml   Filed Weights   06/11/13 2241 06/12/13 0601  Weight: 52.164 kg (115 lb) 52.617 kg (116 lb)    Exam:   General:  Awake, pleasant  Cardiovascular: RRR  Respiratory: CTAB  Abdomen: S/NT/ND/+BS  Extremities: no C/C/E   Neurologic:  Non-focal  Data Reviewed: Basic Metabolic Panel:  Recent Labs Lab 06/11/13 1902 06/12/13 0450 06/13/13 0445  NA 137 137 136  K 3.8 3.3* 3.5  CL 104 103 101  CO2 25 27 29   GLUCOSE 202* 121* 118*  BUN 30* 27* 21  CREATININE 0.70 0.66 0.68  CALCIUM 8.7 8.5 8.5   Liver Function Tests:  Recent Labs Lab 06/11/13 1902 06/12/13 0450  AST 36 26  ALT 18 17  ALKPHOS 56 53   BILITOT 0.6 0.6  PROT 5.5* 5.1*  ALBUMIN 2.8* 2.6*   No results found for this basename: LIPASE, AMYLASE,  in the last 168 hours  Recent Labs Lab 06/11/13 2254  AMMONIA 18   CBC:  Recent Labs Lab 06/11/13 1902 06/12/13 0450 06/13/13 0445  WBC 8.1 8.0 9.4  NEUTROABS 5.6  --   --   HGB 10.9* 10.6* 11.0*  HCT 33.5* 32.6* 34.1*  MCV 88.9 88.8 88.8  PLT 229 200 231   Cardiac Enzymes:  Recent Labs Lab 06/11/13 1902  TROPONINI <0.30   BNP (last 3 results) No results found for this basename: PROBNP,  in the last 8760 hours CBG: No results found for this basename: GLUCAP,  in the last 168 hours  Recent Results (from the past 240 hour(s))  URINE CULTURE     Status: None   Collection Time    06/11/13  7:35 PM      Result Value Range Status   Specimen Description URINE, CLEAN CATCH   Final   Special Requests NONE   Final   Culture  Setup Time     Final   Value: 06/12/2013 03:11     Performed at [facility] Count     Final   Value: 70,000 COLONIES/ML     Performed  at [facility]     Final   Value: Multiple bacterial morphotypes present, none predominant. Suggest appropriate recollection if clinically indicated.     Performed at [facility]   Report Status 06/13/2013 FINAL   Final     Studies: Dg Chest 2 View  06/11/2013   *RADIOLOGY REPORT*  Clinical Data: Fever  CHEST - 2 VIEW  Comparison: 11/28/2012  Findings: The cardiac shadow is stable.  A defibrillator is again seen and stable.  The lungs are clear.  Some scarring is noted in the right lung base.  No sizable effusion is noted.  IMPRESSION: No acute abnormality noted.   Original Report Authenticated By: Alcide Clever, M.D.   Dg Hip Complete Left  06/11/2013   *RADIOLOGY REPORT*  Clinical Data: Left hip pain  LEFT HIP - COMPLETE 2+ VIEW  Comparison: None.  Findings: The pelvic ring is intact.  There is a area of cortical angulation identified the superior aspect of the  femoral neck suspicious for a minimally displaced fracture.  No other focal abnormality is noted.  IMPRESSION: Changes suspicious for an undisplaced subcapital femoral neck fracture.  CT evaluation may be helpful   Original Report Authenticated By: Alcide Clever, M.D.   Ct Head Wo Contrast  06/11/2013   *RADIOLOGY REPORT*  Clinical Data: Confusion and recent head injury  CT HEAD WITHOUT CONTRAST  Technique:  Contiguous axial images were obtained from the base of the skull through the vertex without contrast.  Comparison: 04/27/2010  Findings: The bony calvarium is intact.  Atrophic changes and chronic white matter ischemic changes are noted.  No findings to suggest acute hemorrhage, acute infarction or space-occupying mass lesion are noted.  IMPRESSION: Chronic changes without acute abnormality.   Original Report Authenticated By: Alcide Clever, M.D.   Ct Pelvis Wo Contrast  06/11/2013   *RADIOLOGY REPORT*  Clinical Data: Left hip pain  CT PELVIS WITHOUT CONTRAST  Technique:  Multidetector CT imaging of the pelvis was performed following the standard protocol without intravenous contrast.  Comparison: None.  Findings: The appendix is within normal limits.  The bladder is incompletely distended.  Some bladder wall thickening is noted of uncertain significance.  Diverticular change of the colon is seen.  Degenerative change of the lumbar spine is noted.  The area of abnormalities seen on recent plain film examination is not borne out on the CT examination.  No fractures are seen.  Mild degenerative changes of the hip joints are noted.  No other focal abnormality is seen.  IMPRESSION: No evidence of acute hip fracture.   Original Report Authenticated By: Alcide Clever, M.D.    Scheduled Meds: . aspirin  81 mg Oral BID  . carvedilol  6.25 mg Oral BID WC  . cholecalciferol  1,000 Units Oral Daily  . ciprofloxacin  400 mg Intravenous Q12H  . clopidogrel  75 mg Oral QPM  . digoxin  125 mcg Oral QPM  . enoxaparin  (LOVENOX) injection  40 mg Subcutaneous QHS  . feeding supplement  237 mL Oral TID BM  . finasteride  5 mg Oral q morning - 10a  . gabapentin  300 mg Oral TID  . midodrine  2.5 mg Oral BID  . multivitamin with minerals  1 tablet Oral QPM  . predniSONE  5 mg Oral Q breakfast  . simvastatin  20 mg Oral QPM  . sodium chloride  3 mL Intravenous Q12H   Continuous Infusions:   Principal Problem:  Acute encephalopathy Active Problems:   Dementia   Polymyalgia   UTI (lower urinary tract infection)   Atrial fibrillation   Protein-calorie malnutrition, severe    Time spent: 45 minutes    HERNANDEZ ACOSTA,Alivya Wegman  [facility] Pager [phone]  If 7PM-7AM, please contact night-coverage at [email or web address], password [facility] 06/13/2013, 2:32 PM  LOS: 2 days

## 2013-06-13 NOTE — Progress Notes (Signed)
Pt confused, disoriented time 4, constantly tries to get out of bed, extreme high fall risk. MD notified and orders placed.

## 2013-06-13 NOTE — Progress Notes (Signed)
Dr Perrin Maltese called and asked if Dr Ardyth Harps would call him text page sent to Dr Irine Seal.

## 2013-06-13 NOTE — Progress Notes (Signed)
Skin tear noted to left lower leg dressing applied.

## 2013-06-13 NOTE — Progress Notes (Signed)
INITIAL NUTRITION ASSESSMENT  Pt meets criteria for severe MALNUTRITION in the context of chronic illness as evidenced by pt with visible severe muscle wasting and subcutaneous fat loss in clavicles, upper arm, and orbital/temporal region.  DOCUMENTATION CODES Per approved criteria  -Severe malnutrition in the context of chronic illness -Underweight   INTERVENTION: - Ensure Complete TID - Will continue to monitor   NUTRITION DIAGNOSIS: Increased nutrient needs related to underweight as evidenced by BMI of 15.   Goal: Pt to consume 100% of meals and supplements  Monitor:  Weights, labs, intake  Reason for Assessment: Underweight   79 y.o. male  Admitting Dx: Acute encephalopathy  ASSESSMENT: Pt with dementia and has history of nonischemic cardiomyopathy status post AICD placement, chronic anemia, hyperlipidemia, moderate aortic stenosis, and polymyalgia rheumatica on steroids. Pt on hospice care PTA. Met with pt who was eating lunch. Pt cachetic. Noted pt with some missing teeth. No family present. Pt ate 75% of lunch.    Height: Ht Readings from Last 1 Encounters:  06/11/13 6' (1.829 m)    Weight: Wt Readings from Last 1 Encounters:  06/12/13 116 lb (52.617 kg)    Ideal Body Weight: 178 lb  % Ideal Body Weight: 65%  Wt Readings from Last 10 Encounters:  06/12/13 116 lb (52.617 kg)  05/02/13 123 lb 12.8 oz (56.155 kg)  01/31/13 139 lb (63.05 kg)  11/28/12 141 lb 3.2 oz (64.048 kg)  07/17/12 155 lb (70.308 kg)  04/12/12 151 lb 6.4 oz (68.675 kg)    Usual Body Weight: 141 lb in January 2014  % Usual Body Weight: 82%  BMI:  Body mass index is 15.73 kg/(m^2). Underweight  Estimated Nutritional Needs: Kcal: 2100-2300 Protein: 105-115g Fluid: 2.1-2.3L/day  Skin: Intact   Diet Order: General  EDUCATION NEEDS: -No education needs identified at this time   Intake/Output Summary (Last 24 hours) at 06/13/13 1326 Last data filed at 06/13/13 1312  Gross  per 24 hour  Intake    240 ml  Output    350 ml  Net   -110 ml    Last BM: PTA  Labs:   Recent Labs Lab 06/11/13 1902 06/12/13 0450 06/13/13 0445  NA 137 137 136  K 3.8 3.3* 3.5  CL 104 103 101  CO2 25 27 29   BUN 30* 27* 21  CREATININE 0.70 0.66 0.68  CALCIUM 8.7 8.5 8.5  GLUCOSE 202* 121* 118*    CBG (last 3)  No results found for this basename: GLUCAP,  in the last 72 hours  Scheduled Meds: . aspirin  81 mg Oral BID  . carvedilol  6.25 mg Oral BID WC  . cholecalciferol  1,000 Units Oral Daily  . ciprofloxacin  400 mg Intravenous Q12H  . clopidogrel  75 mg Oral QPM  . digoxin  125 mcg Oral QPM  . enoxaparin (LOVENOX) injection  40 mg Subcutaneous QHS  . finasteride  5 mg Oral q morning - 10a  . gabapentin  300 mg Oral TID  . midodrine  2.5 mg Oral BID  . multivitamin with minerals  1 tablet Oral QPM  . predniSONE  5 mg Oral Q breakfast  . simvastatin  20 mg Oral QPM  . sodium chloride  3 mL Intravenous Q12H    Continuous Infusions:   Past Medical History  Diagnosis Date  . Cardiomyopathy   . Legal blindness   . Dementia   . Hard of hearing   . Hospice care patient  Past Surgical History  Procedure Laterality Date  . Pacemaker insertion    . Gastrectomy  1960  . Cataract extraction, bilateral    . Insert / replace / remove pacemaker    . Tonsillectomy      Levon Hedger MS, RD, LDN [phone] Pager [phone] After Hours Pager

## 2013-06-14 DIAGNOSIS — R404 Transient alteration of awareness: Secondary | ICD-10-CM

## 2013-06-14 DIAGNOSIS — N390 Urinary tract infection, site not specified: Principal | ICD-10-CM

## 2013-06-14 DIAGNOSIS — R4182 Altered mental status, unspecified: Secondary | ICD-10-CM

## 2013-06-14 LAB — URINALYSIS, ROUTINE W REFLEX MICROSCOPIC
Bilirubin Urine: NEGATIVE
Ketones, ur: NEGATIVE mg/dL
Nitrite: NEGATIVE
Urobilinogen, UA: 1 mg/dL (ref 0.0–1.0)

## 2013-06-14 LAB — CBC
HCT: 33.5 % — ABNORMAL LOW (ref 39.0–52.0)
MCHC: 32.8 g/dL (ref 30.0–36.0)
MCV: 88.6 fL (ref 78.0–100.0)
RDW: 15.3 % (ref 11.5–15.5)

## 2013-06-14 LAB — BASIC METABOLIC PANEL
BUN: 16 mg/dL (ref 6–23)
Calcium: 8.6 mg/dL (ref 8.4–10.5)
Creatinine, Ser: 0.8 mg/dL (ref 0.50–1.35)
GFR calc Af Amer: 87 mL/min — ABNORMAL LOW (ref >90)
GFR calc non Af Amer: 75 mL/min — ABNORMAL LOW (ref >90)

## 2013-06-14 MED ORDER — RISPERIDONE 0.25 MG PO TABS
0.2500 mg | ORAL_TABLET | Freq: Two times a day (BID) | ORAL | Status: DC | PRN
  Filled 2013-06-14: qty 1

## 2013-06-14 MED ORDER — ASPIRIN 81 MG PO CHEW
81.0000 mg | CHEWABLE_TABLET | Freq: Every day | ORAL | Status: DC
  Administered 2013-06-15: 81 mg via ORAL
  Filled 2013-06-14: qty 1

## 2013-06-14 MED ORDER — LORAZEPAM 0.5 MG PO TABS
0.5000 mg | ORAL_TABLET | Freq: Every evening | ORAL | Status: DC | PRN
  Administered 2013-06-14: 0.5 mg via ORAL
  Filled 2013-06-14: qty 1

## 2013-06-14 MED ORDER — HYDROXYZINE HCL 10 MG PO TABS
10.0000 mg | ORAL_TABLET | Freq: Three times a day (TID) | ORAL | Status: DC | PRN
  Filled 2013-06-14: qty 1

## 2013-06-14 NOTE — Progress Notes (Signed)
Patient continues to pull mittens of and attempted to remove soft waist belt.  Pt constantly trying to get out of bed. Patient hangs legs over side rails. MD contacted.

## 2013-06-14 NOTE — Progress Notes (Signed)
Patient pulled off statlock. Will re-apply and continue to monitor patient. Urine has a red tint at this time. Research scientist (physical sciences) has patient in sight. Will continue to monitor.

## 2013-06-14 NOTE — Progress Notes (Signed)
ANTIBIOTIC CONSULT NOTE - Follow Up  Pharmacy Consult for Cipro Indication: UTI  Allergies  Allergen Reactions  . Morphine And Related Other (See Comments)    Blood pressure drops   . Penicillins     REACTION: swelling    Patient Measurements: Height: 6' (182.9 cm) Weight: 116 lb (52.617 kg) IBW/kg (Calculated) : 77.6  Vital Signs: Temp: 98.4 F (36.9 C) (08/06 1300) Temp src: Oral (08/06 1300) BP: 92/52 mmHg (08/06 1300) Pulse Rate: 78 (08/06 1300)   Labs:  Recent Labs  06/12/13 0450 06/13/13 0445 06/14/13 1020  WBC 8.0 9.4 8.9  HGB 10.6* 11.0* 11.0*  PLT 200 231 232  CREATININE 0.66 0.68 0.80   Estimated Creatinine Clearance: 42.9 ml/min (by C-G formula based on Cr of 0.8).    Microbiology: Recent Results (from the past 720 hour(s))  URINE CULTURE     Status: None   Collection Time    06/11/13  7:35 PM      Result Value Range Status   Specimen Description URINE, CLEAN CATCH   Final   Special Requests NONE   Final   Culture  Setup Time     Final   Value: 06/12/2013 03:11     Performed at [facility] Count     Final   Value: 70,000 COLONIES/ML     Performed at [facility]   Culture     Final   Value: Multiple bacterial morphotypes present, none predominant. Suggest appropriate recollection if clinically indicated.     Performed at [facility]   Report Status 06/13/2013 FINAL   Final    Medications:  Scheduled:  . [START ON 06/15/2013] aspirin  81 mg Oral Daily  . carvedilol  6.25 mg Oral BID WC  . cholecalciferol  1,000 Units Oral Daily  . ciprofloxacin  400 mg Intravenous Q12H  . digoxin  125 mcg Oral QPM  . feeding supplement  237 mL Oral TID BM  . finasteride  5 mg Oral q morning - 10a  . gabapentin  300 mg Oral TID  . midodrine  2.5 mg Oral BID  . multivitamin with minerals  1 tablet Oral QPM  . predniSONE  5 mg Oral Q breakfast  . simvastatin  20 mg Oral QPM  . sodium chloride  3 mL Intravenous Q12H    Assessment:  79 yr old male presents 06/11/13 with altered mental status and low grade fever at home. Pt with 2 recent falls and complaint of left hip pain.  CT shows no acute hip fracture  Today is Day #4 of 7 of cipro for UTI - dose remains appropriate for renal function.  Urine Cx (-)   Plan:   Continue Cipro 400mg  IV q12h  Annia Belt, PharmD 06/14/2013,1:08 PM

## 2013-06-14 NOTE — Progress Notes (Signed)
Clinical Social Work  CSW met with patient and dtr-in-law Britta Mccreedy) in order to discuss bed offers. CSW explained no available beds at [facility] or [facility]. Family has spoken with [facility] who is agreeable to visit patient in hospital and to speak with family at bedside. CSW explained that patient was in restraints last night but that they have been DC. SNF to talk with family and evaluate patient.   CSW spoke with hospice SW and explained no DC today but agreeable to keep SW updated so that revocation paperwork can be sent to SNF.  CSW will continue to follow.  [place], [place] [phone]

## 2013-06-14 NOTE — Progress Notes (Signed)
[facility] PROGRESS NOTE  Joshua Hester NWG:[id number] DOB: Aug 27, 1917 DOA: 06/11/2013 PCP: Lucilla Edin, MD  Assessment/Plan:  Acute Encephalopathy// Delirium:  -Suspect related to UTI. -Patient became agitated overnight. Suspect delirium, hospital, secondary to infection.  -Patient was not getting at home schedule ativan just PRN at night. I change ativan to HS PRN.  -Psych consulted to help with medications.  -Will discontinue haldol.   Hematuria: This could be traumatic. Will repeat Urine analysis. Hold Lovenox, aspirin and plavix. Check CBC.   UTI -Cx with multiple morphotypes. -Continue with Ciprofloxacin for total 3/7 days.   Dementia -Continue home meds.  Atrial Fibrillation -Has been deemed a non-coumadin candidate in the past due to memory loss, GI bleed and frequent falls.  Polymyalgia Rheumatica -Continue home dose of steroids.  Code Status: DNR Family Communication:  daughter Joshua Hester) at bedside.  Disposition Plan: SNF when stable.    Consultants:  None   Antibiotics:  Cipro   Subjective: Was confuse and agitated last night. He was put on physical restraint. Family does not wants patient on restrain. I discussed this with staff.  Patient calm this morning. Foley catheter in place due to irritation genital area. Hematuria notice.    Objective: Filed Vitals:   06/12/13 2120 06/13/13 0511 06/13/13 1312 06/13/13 2134  BP: 128/71 160/83 116/65 140/79  Pulse: 73 77 73 85  Temp: 97.8 F (36.6 C) 97.8 F (36.6 C) 97.7 F (36.5 C) 98.1 F (36.7 C)  TempSrc: Oral Oral Oral Oral  Resp: 18 18 16 18   Height:      Weight:      SpO2: 97% 96% 96% 95%    Intake/Output Summary (Last 24 hours) at 06/14/13 0954 Last data filed at 06/14/13 0859  Gross per 24 hour  Intake   1580 ml  Output   2375 ml  Net   -795 ml   Filed Weights   06/11/13 2241 06/12/13 0601  Weight: 52.164 kg (115 lb) 52.617 kg (116 lb)    Exam:   General:  Awake,  pleasant  Cardiovascular: RRR  Respiratory: CTAB  Abdomen: S/NT/ND/+BS  Extremities: no C/C/E   Neurologic:  Non-focal  Data Reviewed: Basic Metabolic Panel:  Recent Labs Lab 06/11/13 1902 06/12/13 0450 06/13/13 0445  NA 137 137 136  K 3.8 3.3* 3.5  CL 104 103 101  CO2 25 27 29   GLUCOSE 202* 121* 118*  BUN 30* 27* 21  CREATININE 0.70 0.66 0.68  CALCIUM 8.7 8.5 8.5   Liver Function Tests:  Recent Labs Lab 06/11/13 1902 06/12/13 0450  AST 36 26  ALT 18 17  ALKPHOS 56 53  BILITOT 0.6 0.6  PROT 5.5* 5.1*  ALBUMIN 2.8* 2.6*   No results found for this basename: LIPASE, AMYLASE,  in the last 168 hours  Recent Labs Lab 06/11/13 2254  AMMONIA 18   CBC:  Recent Labs Lab 06/11/13 1902 06/12/13 0450 06/13/13 0445  WBC 8.1 8.0 9.4  NEUTROABS 5.6  --   --   HGB 10.9* 10.6* 11.0*  HCT 33.5* 32.6* 34.1*  MCV 88.9 88.8 88.8  PLT 229 200 231   Cardiac Enzymes:  Recent Labs Lab 06/11/13 1902  TROPONINI <0.30   BNP (last 3 results) No results found for this basename: PROBNP,  in the last 8760 hours CBG: No results found for this basename: GLUCAP,  in the last 168 hours  Recent Results (from the past 240 hour(s))  URINE CULTURE     Status: None  Collection Time    06/11/13  7:35 PM      Result Value Range Status   Specimen Description URINE, CLEAN CATCH   Final   Special Requests NONE   Final   Culture  Setup Time     Final   Value: 06/12/2013 03:11     Performed at [facility] Count     Final   Value: 70,000 COLONIES/ML     Performed at [facility]   Culture     Final   Value: Multiple bacterial morphotypes present, none predominant. Suggest appropriate recollection if clinically indicated.     Performed at [facility]   Report Status 06/13/2013 FINAL   Final     Studies: No results found.  Scheduled Meds: . [START ON 06/15/2013] aspirin  81 mg Oral Daily  . carvedilol  6.25 mg Oral BID WC  .  cholecalciferol  1,000 Units Oral Daily  . ciprofloxacin  400 mg Intravenous Q12H  . clopidogrel  75 mg Oral QPM  . digoxin  125 mcg Oral QPM  . feeding supplement  237 mL Oral TID BM  . finasteride  5 mg Oral q morning - 10a  . gabapentin  300 mg Oral TID  . midodrine  2.5 mg Oral BID  . multivitamin with minerals  1 tablet Oral QPM  . predniSONE  5 mg Oral Q breakfast  . simvastatin  20 mg Oral QPM  . sodium chloride  3 mL Intravenous Q12H   Continuous Infusions:   Principal Problem:   Acute encephalopathy Active Problems:   Dementia   Polymyalgia   UTI (lower urinary tract infection)   Atrial fibrillation   Protein-calorie malnutrition, severe    Time spent: 35 minutes    [name],[name]  [facility] Pager [phone]  If 7PM-7AM, please contact night-coverage at [email or web address], password [facility] 06/14/2013, 9:54 AM  LOS: 3 days

## 2013-06-14 NOTE — Evaluation (Signed)
Physical Therapy Evaluation Patient Details Name: Joshua Hester MRN: [id number] DOB: 06/05/1917 Today's Date: 06/14/2013 Time: 8657-8469 PT Time Calculation (min): 19 min  PT Assessment / Plan / Recommendation History of Present Illness  pt admitted 06/11/13 with AMS, difficulty walking. Pt was on Hospice. Now for SNF.  Clinical Impression  Pt wanting on finding his hearing aid. Was becoming more restless with moving in bed, trying to get legs over edge. Did not attempt to get to edge of bed at this time.Will give trial of therapy if pt able to participate.    PT Assessment  Patient needs continued PT services (trial if able to participate.)    Follow Up Recommendations  SNF    Does the patient have the potential to tolerate intense rehabilitation      Barriers to Discharge Decreased caregiver support      Equipment Recommendations  None recommended by PT    Recommendations for Other Services     Frequency Min 3X/week (trial)    Precautions / Restrictions Precautions Precautions: Fall Precaution Comments: can be agitated.   Pertinent Vitals/Pain none      Mobility  Bed Mobility Bed Mobility: Rolling Right;Rolling Left Rolling Right: 3: Mod assist;With rail Rolling Left: 3: Mod assist;With rail Details for Bed Mobility Assistance: tactile cues to reach for the rails and roll Transfers Transfers: Not assessed Details for Transfer Assistance: pt became more restless with moving around in bed. Ambulation/Gait Ambulation/Gait Assistance: Not tested (comment)    Exercises     PT Diagnosis: Altered mental status  PT Problem List: Decreased activity tolerance;Decreased mobility;Decreased cognition PT Treatment Interventions: Functional mobility training;Therapeutic activities     PT Goals(Current goals can be found in the care plan section) Acute Rehab PT Goals Patient Stated Goal: pt unable PT Goal Formulation: Patient unable to participate in goal  setting Time For Goal Achievement: 06/28/13 Potential to Achieve Goals: Fair  Visit Information  Last PT Received On: 06/14/13 Assistance Needed: +2 History of Present Illness: pt admitted 06/11/13 with AMS, difficulty walking. Pt was on Hospice. Now for SNF.       Prior Functioning  Home Living Family/patient expects to be discharged to:: Private residence Available Help at Discharge: Skilled Nursing Facility    Cognition  Cognition Overall Cognitive Status: Impaired/Different from baseline Area of Impairment: Attention;Following commands Following Commands: Follows one step commands inconsistently    Extremity/Trunk Assessment Upper Extremity Assessment Upper Extremity Assessment: Generalized weakness Lower Extremity Assessment Lower Extremity Assessment: Generalized weakness;Difficult to assess due to impaired cognition (did not attempt to stan. moves legs in bed.)   Balance    End of Session PT - End of Session Activity Tolerance: Treatment limited secondary to agitation Patient left: in bed;with call bell/phone within reach;with nursing/sitter in room Nurse Communication: Mobility status, CNA in Room.  GP     Joshua Hester 06/14/2013, 4:16 PM

## 2013-06-14 NOTE — Progress Notes (Signed)
MD paged pt continues to attempt to get out of bed, violent towards staff. Pt placed in four point restraints. Will monitor skin integrity and circulation frequently.

## 2013-06-14 NOTE — Consult Note (Signed)
Reason for Consult: Agitation and AMS Referring Physician: Alba Cory, MD   Joshua Hester is an 79 y.o. male.  HPI: MONTE ZINNI is a 79 y.o. white male admitted to [facility] long medical floor with altered mental status and UTI. He has been confused and agitated sporadically. Reportedly he was agitated last night which required physical restraints. He has no previous history of mental illness. He has no family at bed side. He is poor historian and his base line cognition and functionality needs to be obtained from family.   He has history of nonischemic cardiomyopathy status post AICD placement, if ablation, chronic anemia, hyperlipidemia, moderate aortic stenosis, polymyalgia rheumatica on steroids. Reportedly has history of frequent falls and his work up is negative for head injury or fractures.   MSE: Patient appeared in his bed sleeping on his back and sitter is at door. He is easily awake and able to participate on assessment. He has no orientation and confused about his situation and condition. He knows his name and date of birth and age. He has not able answer most of the mini mental questionnaire. He continues to be confused but no apparent agitation or aggression during this visit. He has not responding to internal stimuli.  Past Medical History  Diagnosis Date  . Cardiomyopathy   . Legal blindness   . Dementia   . Hard of hearing   . Hospice care patient     Past Surgical History  Procedure Laterality Date  . Pacemaker insertion    . Gastrectomy  1960  . Cataract extraction, bilateral    . Insert / replace / remove pacemaker    . Tonsillectomy      History reviewed. No pertinent family history.  Social History:  reports that he has quit smoking. He has never used smokeless tobacco. He reports that he does not drink alcohol or use illicit drugs.  Allergies:  Allergies  Allergen Reactions  . Morphine And Related Other (See Comments)    Blood pressure drops    . Penicillins     REACTION: swelling    Medications: I have reviewed the patient's current medications.  Results for orders placed during the hospital encounter of 06/11/13 (from the past 48 hour(s))  CBC     Status: Abnormal   Collection Time    06/13/13  4:45 AM      Result Value Range   WBC 9.4  4.0 - 10.5 K/uL   RBC 3.84 (*) 4.22 - 5.81 MIL/uL   Hemoglobin 11.0 (*) 13.0 - 17.0 g/dL   HCT 16.1 (*) 09.6 - 04.5 %   MCV 88.8  78.0 - 100.0 fL   MCH 28.6  26.0 - 34.0 pg   MCHC 32.3  30.0 - 36.0 g/dL   RDW 40.9  81.1 - 91.4 %   Platelets 231  150 - 400 K/uL  BASIC METABOLIC PANEL     Status: Abnormal   Collection Time    06/13/13  4:45 AM      Result Value Range   Sodium 136  135 - 145 mEq/L   Potassium 3.5  3.5 - 5.1 mEq/L   Chloride 101  96 - 112 mEq/L   CO2 29  19 - 32 mEq/L   Glucose, Bld 118 (*) 70 - 99 mg/dL   BUN 21  6 - 23 mg/dL   Creatinine, Ser 7.82  0.50 - 1.35 mg/dL   Calcium 8.5  8.4 - 95.6 mg/dL   GFR  calc non Af Amer 80 (*) >90 mL/min   GFR calc Af Amer >90  >90 mL/min   Comment:            The eGFR has been calculated     using the CKD EPI equation.     This calculation has not been     validated in all clinical     situations.     eGFR's persistently     <90 mL/min signify     possible Chronic Kidney Disease.  URINALYSIS, ROUTINE W REFLEX MICROSCOPIC     Status: Abnormal   Collection Time    06/14/13 10:17 AM      Result Value Range   Color, Urine AMBER (*) YELLOW   Comment: BIOCHEMICALS MAY BE AFFECTED BY COLOR   APPearance CLOUDY (*) CLEAR   Specific Gravity, Urine 1.015  1.005 - 1.030   pH 6.0  5.0 - 8.0   Glucose, UA NEGATIVE  NEGATIVE mg/dL   Hgb urine dipstick LARGE (*) NEGATIVE   Bilirubin Urine NEGATIVE  NEGATIVE   Ketones, ur NEGATIVE  NEGATIVE mg/dL   Protein, ur 756 (*) NEGATIVE mg/dL   Urobilinogen, UA 1.0  0.0 - 1.0 mg/dL   Nitrite NEGATIVE  NEGATIVE   Leukocytes, UA MODERATE (*) NEGATIVE  URINE MICROSCOPIC-ADD ON     Status:  None   Collection Time    06/14/13 10:17 AM      Result Value Range   RBC / HPF TOO NUMEROUS TO COUNT  <3 RBC/hpf   Urine-Other URINALYSIS PERFORMED ON SUPERNATANT     Comment: FIELD OBSCURED BY RBC'S  CBC     Status: Abnormal   Collection Time    06/14/13 10:20 AM      Result Value Range   WBC 8.9  4.0 - 10.5 K/uL   RBC 3.78 (*) 4.22 - 5.81 MIL/uL   Hemoglobin 11.0 (*) 13.0 - 17.0 g/dL   HCT 43.3 (*) 29.5 - 18.8 %   MCV 88.6  78.0 - 100.0 fL   MCH 29.1  26.0 - 34.0 pg   MCHC 32.8  30.0 - 36.0 g/dL   RDW 41.6  60.6 - 30.1 %   Platelets 232  150 - 400 K/uL  BASIC METABOLIC PANEL     Status: Abnormal   Collection Time    06/14/13 10:20 AM      Result Value Range   Sodium 138  135 - 145 mEq/L   Potassium 3.6  3.5 - 5.1 mEq/L   Chloride 103  96 - 112 mEq/L   CO2 28  19 - 32 mEq/L   Glucose, Bld 151 (*) 70 - 99 mg/dL   BUN 16  6 - 23 mg/dL   Creatinine, Ser 6.01  0.50 - 1.35 mg/dL   Calcium 8.6  8.4 - 09.3 mg/dL   GFR calc non Af Amer 75 (*) >90 mL/min   GFR calc Af Amer 87 (*) >90 mL/min   Comment:            The eGFR has been calculated     using the CKD EPI equation.     This calculation has not been     validated in all clinical     situations.     eGFR's persistently     <90 mL/min signify     possible Chronic Kidney Disease.    No results found.  Positive for aggressive behavior and cognitive deficits Blood pressure 92/52, pulse 78, temperature 98.4  F (36.9 C), temperature source Oral, resp. rate 20, height 6' (1.829 m), weight 52.617 kg (116 lb), SpO2 95.00%.   Assessment/Plan: AMS  Vs delirium UTI  Plan: Start Risperidone 0.25 mg BID /PRN for agitation or aggression Start Vistaril 10 mg TID for anxiety PRN Appreciate psych consult and will sign off at this time  Jaiquan Temme,JANARDHAHA R. 06/14/2013, 5:00 PM

## 2013-06-14 NOTE — Progress Notes (Signed)
Room 1512 - Joshua Hester - [facility] RN Visit-R.[name] RN  Related admission to [facility] diagnosis of Alzheimer's.   Pt is DNR code.    Pt alert, sitting up in bed eating breakfast, without complaints of pain or discomfort.  Dtr [name] present. Patient's home medication list is on shadow chart.  Pt had episode of agitation, combativeness in early morning hours requiring use of sitter for safety.  Pt oriented to self, able to converse short sentences, smiling and calm.  Per dtr Britta Mccreedy, pt to be transferred to SNF for rehab - choice of facility is [facility], otherwise, [facility] will evaluate today.  Pt needed restraints yesterday, and transfer will not take place before tomorrow.   [facility] SW has signed revocation forms and will date on patient's date of discharge from [facility].   Please call [facility] @ [phone]- ask for RN Liaison or after hours,ask for on-call RN with any hospice needs.   Thank you.  Joneen Boers, RN  [facility]  Hospice Liaison

## 2013-06-15 ENCOUNTER — Other Ambulatory Visit: Payer: Self-pay | Admitting: *Deleted

## 2013-06-15 MED ORDER — RISPERIDONE 0.25 MG PO TABS: 0.2500 mg | ORAL_TABLET | Freq: Two times a day (BID) | ORAL | Status: AC | PRN

## 2013-06-15 MED ORDER — ENSURE COMPLETE PO LIQD: 237.0000 mL | Freq: Three times a day (TID) | ORAL | Status: AC

## 2013-06-15 MED ORDER — LORAZEPAM 0.5 MG PO TABS: ORAL_TABLET | ORAL | Status: AC

## 2013-06-15 MED ORDER — ASPIRIN 81 MG PO TABS: 81.0000 mg | ORAL_TABLET | Freq: Every day | ORAL | Status: AC

## 2013-06-15 MED ORDER — LORAZEPAM 0.5 MG PO TABS: 0.5000 mg | ORAL_TABLET | Freq: Every evening | ORAL | Status: DC | PRN

## 2013-06-15 MED ORDER — CIPROFLOXACIN HCL 500 MG PO TABS: 500.0000 mg | ORAL_TABLET | Freq: Two times a day (BID) | ORAL | Status: AC

## 2013-06-15 MED ORDER — GABAPENTIN 300 MG PO CAPS: 300.0000 mg | ORAL_CAPSULE | Freq: Three times a day (TID) | ORAL | Status: AC

## 2013-06-15 NOTE — Progress Notes (Addendum)
Clinical Social Work  CSW faxed DC summary to [facility] who is agreeable to admission. Patient's family completed paperwork on 06/14/13 and SNF is agreeable to accept patient. CSW spoke with Eber Jones who is aware that patient continues to have a sitter and is fine with accepting patient since he has been without restraints in 24 hours. SNF reports that family is agreeable to pay for private sitters if necessary. CSW met with patient and wife at bedside who are agreeable to DC. Dr. Ernestene Mention secretary called CSW and asked MD to return his call. CSW text paged MD his contact information.  Patient, wife and RN all agreeable to DC to SNF today. Dtr called CSW who explained DC plans as well and she is agreeable. CSW spoke with Rose from [facility] and updated her on patient's DC plans.  Wife prefers patient transport via [facility]. CSW explained no guaranteed coverage for [facility] and that insurance would be billed. CSW attempted to place ambulance order via website but website would not load. CSW called [facility] number and placed order for transportation. CSW placed DC packet in [place] and RN to call report. CSW is signing off but available if further needs arise.  [place], [place] [phone]

## 2013-06-15 NOTE — Discharge Summary (Addendum)
Physician Discharge Summary  Joshua Hester:[id number] DOB: 10-Jan-1917 DOA: 06/11/2013  PCP: Lucilla Edin, MD  Admit date: 06/11/2013 Discharge date: 06/15/2013  Time spent: 35 minutes  Recommendations for Outpatient Follow-up:  1. Need CBC to follow hb 2. Need repeat ua to document resolution UTI and hematuria.  3. Follow up with urology for further evaluation of hematuria.  4. Resume plavix when hb stable, hematuria resolved and after discussion with patient PCP.   Discharge Diagnoses:    Acute encephalopathy   UTI (lower urinary tract infection)   Dementia   Polymyalgia   Atrial fibrillation   Protein-calorie malnutrition, severe   Discharge Condition: Stable.  Diet recommendation: Heart Healthy  Filed Weights   06/11/13 2241 06/12/13 0601  Weight: 52.164 kg (115 lb) 52.617 kg (116 lb)    History of present illness:  Joshua Hester is a 79 y.o. male history of nonischemic cardiomyopathy status post AICD placement, if ablation, chronic anemia, hyperlipidemia, moderate aortic stenosis, polymyalgia rheumatica on steroids was brought to the ER patient was found to have confusion as noticed by patient's daughter. ER physician had discussed with the family. In addition patient was noticed to have frequent falls and had complained of left hip pain. Initial x-ray showed possibility of fracture but CT done showed no fracture. Labs revealed urine tract infection and at this time patient has been admitted for further management. Patient on my exam is distress and denies any pain. Follows commands.   Hospital Course:  Patient admitted with AMS, encephalopathy, thought to be secondary to UTI. Patient stable to be transfer to SNF.   Acute Encephalopathy// Delirium:  -Suspect related to UTI.  -Patient became agitated overnight. Suspect delirium, hospital, secondary to infection.  -Patient was not getting at home schedule ativan just PRN at night. I change ativan to HS PRN.  -Psych  consulted to help with medications. Psych recommend Risperidone o.25 mg BID PRN for agitation. Could consider Vistaril 10 mg TID for agitation.  -Patient is calm, pleasantly confuse.   Hematuria: This could be traumatic, vs infection. Repeat  Urine analysis with less WBC. Urine more clear this morning. .  Lovenox,  and plavix were on hold. Hb stable at 11. Follow up with urology outpatient for possible cystoscopy.   UTI  -Cx with multiple morphotypes.  -Continue with Ciprofloxacin for total 4/10 days.   Dementia  -Continue home meds.   Atrial Fibrillation  -Has been deemed a non-coumadin candidate in the past due to memory loss, GI bleed and frequent falls. Resume baby dose aspirin.   Polymyalgia Rheumatica  -Continue home dose of steroids.   Procedures:  none  Consultations:  Psych  Discharge Exam: Filed Vitals:   06/15/13 0525  BP: 112/68  Pulse: 79  Temp: 98 F (36.7 C)  Resp: 16    General: No distress. Calm.  Cardiovascular: S 1, S 2 RRR Respiratory: CTA  Discharge Instructions  Discharge Orders   Future Orders Complete By Expires     Diet - low sodium heart healthy  As directed     Increase activity slowly  As directed         Medication List         aspirin 81 MG tablet  Take 1 tablet (81 mg total) by mouth daily.     carvedilol 6.25 MG tablet  Commonly known as:  COREG  Take 6.25 mg by mouth 2 (two) times daily with a meal.     cholecalciferol 1000 UNITS  tablet  Commonly known as:  VITAMIN D  Take 1,000 Units by mouth daily.     ciprofloxacin 500 MG tablet  Commonly known as:  CIPRO  Take 1 tablet (500 mg total) by mouth 2 (two) times daily.              digoxin 0.125 MG tablet  Commonly known as:  LANOXIN  Take 125 mcg by mouth every evening.     feeding supplement Liqd  Take 237 mLs by mouth 3 (three) times daily between meals.     finasteride 5 MG tablet  Commonly known as:  PROSCAR  Take 5 mg by mouth every morning.      gabapentin 300 MG capsule  Commonly known as:  NEURONTIN  Take 1 capsule (300 mg total) by mouth 3 (three) times daily.     LORazepam 0.5 MG tablet  Commonly known as:  ATIVAN  Take 1 tablet (0.5 mg total) by mouth at bedtime as needed for anxiety.     midodrine 2.5 MG tablet  Commonly known as:  PROAMATINE  Take 2.5 mg by mouth 2 (two) times daily.     multivitamin tablet  Take 1 tablet by mouth every evening.     predniSONE 5 MG tablet  Commonly known as:  DELTASONE  Take 1 tablet (5 mg total) by mouth daily. NEED VISIT, LABS!     risperiDONE 0.25 MG tablet  Commonly known as:  RISPERDAL  Take 1 tablet (0.25 mg total) by mouth 2 (two) times daily as needed (agitation).     SELAN + ZINC OXIDE EX  Apply 1 application topically as needed (Apply to buttocks for redness and protection from wetness).     sennosides-docusate sodium 8.6-50 MG tablet  Commonly known as:  SENOKOT-S  Take 1 tablet by mouth 2 (two) times daily as needed for constipation.     simvastatin 20 MG tablet  Commonly known as:  ZOCOR  Take 20 mg by mouth every evening.       Allergies  Allergen Reactions  . Morphine And Related Other (See Comments)    Blood pressure drops   . Penicillins     REACTION: swelling      The results of significant diagnostics from this hospitalization (including imaging, microbiology, ancillary and laboratory) are listed below for reference.    Significant Diagnostic Studies: Dg Chest 2 View  06/11/2013   *RADIOLOGY REPORT*  Clinical Data: Fever  CHEST - 2 VIEW  Comparison: 11/28/2012  Findings: The cardiac shadow is stable.  A defibrillator is again seen and stable.  The lungs are clear.  Some scarring is noted in the right lung base.  No sizable effusion is noted.  IMPRESSION: No acute abnormality noted.   Original Report Authenticated By: Alcide Clever, M.D.   Dg Hip Complete Left  06/11/2013   *RADIOLOGY REPORT*  Clinical Data: Left hip pain  LEFT HIP - COMPLETE 2+ VIEW   Comparison: None.  Findings: The pelvic ring is intact.  There is a area of cortical angulation identified the superior aspect of the femoral neck suspicious for a minimally displaced fracture.  No other focal abnormality is noted.  IMPRESSION: Changes suspicious for an undisplaced subcapital femoral neck fracture.  CT evaluation may be helpful   Original Report Authenticated By: Alcide Clever, M.D.   Ct Head Wo Contrast  06/11/2013   *RADIOLOGY REPORT*  Clinical Data: Confusion and recent head injury  CT HEAD WITHOUT CONTRAST  Technique:  Contiguous axial  images were obtained from the base of the skull through the vertex without contrast.  Comparison: 04/27/2010  Findings: The bony calvarium is intact.  Atrophic changes and chronic white matter ischemic changes are noted.  No findings to suggest acute hemorrhage, acute infarction or space-occupying mass lesion are noted.  IMPRESSION: Chronic changes without acute abnormality.   Original Report Authenticated By: Alcide Clever, M.D.   Ct Pelvis Wo Contrast  06/11/2013   *RADIOLOGY REPORT*  Clinical Data: Left hip pain  CT PELVIS WITHOUT CONTRAST  Technique:  Multidetector CT imaging of the pelvis was performed following the standard protocol without intravenous contrast.  Comparison: None.  Findings: The appendix is within normal limits.  The bladder is incompletely distended.  Some bladder wall thickening is noted of uncertain significance.  Diverticular change of the colon is seen.  Degenerative change of the lumbar spine is noted.  The area of abnormalities seen on recent plain film examination is not borne out on the CT examination.  No fractures are seen.  Mild degenerative changes of the hip joints are noted.  No other focal abnormality is seen.  IMPRESSION: No evidence of acute hip fracture.   Original Report Authenticated By: Alcide Clever, M.D.    Microbiology: Recent Results (from the past 240 hour(s))  URINE CULTURE     Status: None   Collection Time     06/11/13  7:35 PM      Result Value Range Status   Specimen Description URINE, CLEAN CATCH   Final   Special Requests NONE   Final   Culture  Setup Time     Final   Value: 06/12/2013 03:11     Performed at [facility] Count     Final   Value: 70,000 COLONIES/ML     Performed at [facility]   Culture     Final   Value: Multiple bacterial morphotypes present, none predominant. Suggest appropriate recollection if clinically indicated.     Performed at [facility]   Report Status 06/13/2013 FINAL   Final     Labs: Basic Metabolic Panel:  Recent Labs Lab 06/11/13 1902 06/12/13 0450 06/13/13 0445 06/14/13 1020  NA 137 137 136 138  K 3.8 3.3* 3.5 3.6  CL 104 103 101 103  CO2 25 27 29 28   GLUCOSE 202* 121* 118* 151*  BUN 30* 27* 21 16  CREATININE 0.70 0.66 0.68 0.80  CALCIUM 8.7 8.5 8.5 8.6   Liver Function Tests:  Recent Labs Lab 06/11/13 1902 06/12/13 0450  AST 36 26  ALT 18 17  ALKPHOS 56 53  BILITOT 0.6 0.6  PROT 5.5* 5.1*  ALBUMIN 2.8* 2.6*   No results found for this basename: LIPASE, AMYLASE,  in the last 168 hours  Recent Labs Lab 06/11/13 2254  AMMONIA 18   CBC:  Recent Labs Lab 06/11/13 1902 06/12/13 0450 06/13/13 0445 06/14/13 1020  WBC 8.1 8.0 9.4 8.9  NEUTROABS 5.6  --   --   --   HGB 10.9* 10.6* 11.0* 11.0*  HCT 33.5* 32.6* 34.1* 33.5*  MCV 88.9 88.8 88.8 88.6  PLT 229 200 231 232   Cardiac Enzymes:  Recent Labs Lab 06/11/13 1902  TROPONINI <0.30   BNP: BNP (last 3 results) No results found for this basename: PROBNP,  in the last 8760 hours CBG: No results found for this basename: GLUCAP,  in the last 168 hours     Signed:  Donnalee Cellucci  [facility] 06/15/2013, 9:41 AM

## 2013-06-15 NOTE — Progress Notes (Signed)
Discharge instructions accompanied pt, left the unit in stable condition via ambulance to SNF. 

## 2013-06-15 NOTE — Progress Notes (Signed)
Room 1512 - Jiles Harold - [facility] RN Visit-R.Kaspian Muccio RN  Related admission to [facility] diagnosis of Alzheimer's.  Pt is DNR code-new signed DNR on chart for transport today to [facility].    Pt arousable, confused, lying upright in bed, without complaints of pain or discomfort.   Pt states he has "heart pain" then clarified that it was "for those that have gone before him."  Sitter present at bedside, no family present. Patient's home medication list is on shadow chart.  Confirmed with hospital SW Wilson - pt is discharging to [facility] today.  Text message to [facility] SW-revocation papers already signed and will be dated today.   Please call [facility] @ [phone]- ask for RN Liaison or after hours,ask for on-call RN with any hospice needs.   Thank you.  Joneen Boers, RN  [facility]  Hospice Liaison

## 2013-06-19 ENCOUNTER — Non-Acute Institutional Stay (SKILLED_NURSING_FACILITY): Payer: Medicare Other | Admitting: Internal Medicine

## 2013-06-19 DIAGNOSIS — M353 Polymyalgia rheumatica: Secondary | ICD-10-CM

## 2013-06-19 DIAGNOSIS — E7800 Pure hypercholesterolemia, unspecified: Secondary | ICD-10-CM

## 2013-06-19 DIAGNOSIS — F0390 Unspecified dementia without behavioral disturbance: Secondary | ICD-10-CM

## 2013-06-19 DIAGNOSIS — I4891 Unspecified atrial fibrillation: Secondary | ICD-10-CM

## 2013-06-26 ENCOUNTER — Non-Acute Institutional Stay (SKILLED_NURSING_FACILITY): Payer: Medicare Other | Admitting: Adult Health

## 2013-06-26 DIAGNOSIS — R52 Pain, unspecified: Secondary | ICD-10-CM

## 2013-06-26 DIAGNOSIS — F0390 Unspecified dementia without behavioral disturbance: Secondary | ICD-10-CM

## 2013-07-11 ENCOUNTER — Telehealth: Payer: Self-pay

## 2013-07-11 ENCOUNTER — Non-Acute Institutional Stay (SKILLED_NURSING_FACILITY): Payer: Medicare Other | Admitting: Adult Health

## 2013-07-11 DIAGNOSIS — I4891 Unspecified atrial fibrillation: Secondary | ICD-10-CM

## 2013-07-11 DIAGNOSIS — E7800 Pure hypercholesterolemia, unspecified: Secondary | ICD-10-CM | POA: Insufficient documentation

## 2013-07-11 DIAGNOSIS — I509 Heart failure, unspecified: Secondary | ICD-10-CM

## 2013-07-11 DIAGNOSIS — N400 Enlarged prostate without lower urinary tract symptoms: Secondary | ICD-10-CM

## 2013-07-11 DIAGNOSIS — F0390 Unspecified dementia without behavioral disturbance: Secondary | ICD-10-CM

## 2013-07-11 NOTE — Telephone Encounter (Signed)
FOR DR DAUB SUE WOULD LIKE TO SPEAK WITH YOU REGARDING HER HUSBAND. PLEASE CALL [phone]

## 2013-07-11 NOTE — Telephone Encounter (Signed)
I returned a call ahead with a message on the answering machine for patient to call me tomorrow .

## 2013-07-11 NOTE — Progress Notes (Signed)
Patient ID: RAD GRAMLING, male   DOB: 09-05-1917, 79 y.o.   MRN: [id number]        HISTORY & PHYSICAL  DATE: 06/19/2013   FACILITY: [facility]  LEVEL OF CARE: SNF (31)  ALLERGIES:  Allergies  Allergen Reactions  . Morphine And Related Other (See Comments)    Blood pressure drops   . Penicillins     REACTION: swelling    CHIEF COMPLAINT:  Manage dementia, atrial fibrillation, and PMR.    HISTORY OF PRESENT ILLNESS:  The patient is a 79 year-old, Caucasian male who was hospitalized for acute encephalopathy.  After hospitalization, he is admitted to this facility for short-term rehabilitation.   He has the following problems:    DEMENTIA: The dementia remains stable and continues to function adequately in the current living environment with supervision.  The patient has had little changes in behavior. No complications noted from the medications presently being used.  The patient is a poor historian.     ATRIAL FIBRILLATION: the patients atrial fibrillation remains stable.  The patient denies DOE, tachycardia, orthopnea, transient neurological sx, pedal edema, palpitations, & PNDs.  No complications noted from the medications currently being used.    POLYMYALGIA RHEUMATICA:  Patient is  currently on prednisone 5 mg q.d. and tolerates it without any problems.  He denies ongoing pain.    PAST MEDICAL HISTORY :  Past Medical History  Diagnosis Date  . Cardiomyopathy   . Legal blindness   . Dementia   . Hard of hearing   . Hospice care patient     PAST SURGICAL HISTORY: Past Surgical History  Procedure Laterality Date  . Pacemaker insertion    . Gastrectomy  1960  . Cataract extraction, bilateral    . Insert / replace / remove pacemaker    . Tonsillectomy      SOCIAL HISTORY:  reports that he has quit smoking. He has never used smokeless tobacco. He reports that he does not drink alcohol or use illicit drugs.  FAMILY HISTORY: None  CURRENT  MEDICATIONS: Reviewed per [facility]  REVIEW OF SYSTEMS:  Difficult to obtain due to dementia.    PHYSICAL EXAMINATION  VS:  T 97.5       P 70      RR 18      BP 102/69      POX%        WT (Lb)  GENERAL: no acute distress, normal body habitus EYES: conjunctivae normal, sclerae normal, normal eye lids MOUTH/THROAT: lips without lesions,no lesions in the mouth,tongue is without lesions,uvula elevates in midline NECK: supple, trachea midline, no neck masses, no thyroid tenderness, no thyromegaly LYMPHATICS: no LAN in the neck, no supraclavicular LAN RESPIRATORY: breathing is even & unlabored, BS CTAB CARDIAC: RRR, no murmur,no extra heart sounds, no edema GI:  ABDOMEN: abdomen soft, normal BS, no masses, no tenderness  LIVER/SPLEEN: no hepatomegaly, no splenomegaly MUSCULOSKELETAL: HEAD: normal to inspection & palpation BACK: no kyphosis, scoliosis or spinal processes tenderness EXTREMITIES: LEFT UPPER EXTREMITY: full range of motion, normal strength & tone RIGHT UPPER EXTREMITY:  full range of motion, normal strength & tone LEFT LOWER EXTREMITY: strength intact, range of motion minimal  RIGHT LOWER EXTREMITY: strength intact, range of motion minimal  PSYCHIATRIC: the patient is alert & oriented to person, affect & behavior appropriate  LABS/RADIOLOGY: CT of the left hip:  No fracture.    Chest x-ray:  No acute disease.     Left  hip x-ray:  Nondisplaced subcapital femoral neck fracture.    CT of the head:  No acute findings.    CT of the pelvis:  No acute findings.    Urine culture showed insignificant growth.    Glucose 151, otherwise BMP normal.    Total protein 5.1, albumin 2.6, otherwise liver profile normal.    Ammonia level 18.    Hemoglobin 11, MCV 88.6, otherwise CBC normal.    Troponin-I less than 0.03.    ASSESSMENT/PLAN:  Dementia.  Stable.    Atrial fibrillation.  Rate controlled.  Discontinue Plavix on family request.    PMR.  Decrease prednisone to 2.5 mg  q.d.  We will wean off Proamatine.    Hyperlipidemia.  Discontinue Zocor on family request.    BPH.  Continue finasteride.    Check CBC and BMP.    I spoke with patient's daughter and son-in-law and discussed above medication changes.    I have reviewed patient's medical records received at admission/from hospitalization.  CPT CODE: 96295

## 2013-07-12 ENCOUNTER — Telehealth: Payer: Self-pay

## 2013-07-12 NOTE — Telephone Encounter (Signed)
Dr. Arther Abbott wants to talk to you regarding patient.   [phone]

## 2013-07-12 NOTE — Telephone Encounter (Signed)
Dr Daub? 

## 2013-07-12 NOTE — Telephone Encounter (Signed)
Wife had questions regarding organ donation

## 2013-07-13 ENCOUNTER — Other Ambulatory Visit: Payer: Self-pay | Admitting: *Deleted

## 2013-07-13 ENCOUNTER — Non-Acute Institutional Stay (SKILLED_NURSING_FACILITY): Payer: Medicare Other | Admitting: Adult Health

## 2013-07-13 DIAGNOSIS — M353 Polymyalgia rheumatica: Secondary | ICD-10-CM

## 2013-07-13 DIAGNOSIS — I509 Heart failure, unspecified: Secondary | ICD-10-CM

## 2013-07-13 DIAGNOSIS — F411 Generalized anxiety disorder: Secondary | ICD-10-CM

## 2013-07-13 DIAGNOSIS — F0390 Unspecified dementia without behavioral disturbance: Secondary | ICD-10-CM

## 2013-07-13 DIAGNOSIS — I4891 Unspecified atrial fibrillation: Secondary | ICD-10-CM

## 2013-07-13 DIAGNOSIS — R52 Pain, unspecified: Secondary | ICD-10-CM

## 2013-07-13 DIAGNOSIS — F419 Anxiety disorder, unspecified: Secondary | ICD-10-CM

## 2013-07-13 DIAGNOSIS — N400 Enlarged prostate without lower urinary tract symptoms: Secondary | ICD-10-CM

## 2013-07-13 DIAGNOSIS — E43 Unspecified severe protein-calorie malnutrition: Secondary | ICD-10-CM

## 2013-07-13 MED ORDER — MORPHINE SULFATE (CONCENTRATE) 20 MG/ML PO SOLN: ORAL | Status: AC

## 2013-08-06 DIAGNOSIS — R52 Pain, unspecified: Secondary | ICD-10-CM | POA: Insufficient documentation

## 2013-08-06 DIAGNOSIS — N400 Enlarged prostate without lower urinary tract symptoms: Secondary | ICD-10-CM | POA: Insufficient documentation

## 2013-08-06 NOTE — Progress Notes (Signed)
Patient ID: Joshua Hester, male   DOB: July 08, 1917, 79 y.o.   MRN: [id number]       PROGRESS NOTE  DATE: 07/11/2013  FACILITY:  [facility]  LEVEL OF CARE: SNF (31)  Acute Visit  CHIEF COMPLAINT:  Manage Atrial Fibrillation, CHF, Dementia and BPH  HISTORY OF PRESENT ILLNESS: This is a 79 year old male who was noted to be sleepy. He has refused therapy for today as well as food. Dr. Perrin Maltese, son-in-law, requested medication review with him. Patient has history of having hospice care.  PAST MEDICAL HISTORY : Reviewed.  No changes.  CURRENT MEDICATIONS: Reviewed per [facility]  REVIEW OF SYSTEMS: unable to obtain due to advance Dementia  PHYSICAL EXAMINATION  VS:  T 97.2       P80       RR20       BP98/64             WT116.3 (Lb)  GENERAL: no acute distress, thin body habitus EYES: conjunctivae normal, sclerae normal, normal eye lids NECK: supple, trachea midline, no neck masses, no thyroid tenderness, no thyromegaly RESPIRATORY: breathing is even & unlabored, BS CTAB CARDIAC: RRR, no murmur,no extra heart sounds, no edema GI: abdomen soft, normal BS, no masses, no tenderness, no hepatomegaly, no splenomegaly PSYCHIATRIC: sleepy  LABS/RADIOLOGY: 06/26/13 sodium 139 potassium 4.0 glucose 148 BUN 30 creatinine 0.8 calcium 8.5 total protein 5.3 albumin 3.0 liver enzymes normal WBC 7.1 hemoglobin 9.6 hematocrit 31.9 06/20/13 sodium 137 potassium 4.6 glucose 120 BUN 40 creatinine 1.0 calcium 8.9  ASSESSMENT/PLAN:  Dementia with agitation - discontinue Risperidone  CHF - taper digoxin then discontinue  Atrial Fibrillation - taper Coreg then discontinue; discontinue ASA  BPH - discontinue Proscar and Flomax  CPT CODE: 08657

## 2013-08-06 NOTE — Progress Notes (Signed)
Patient ID: Joshua Hester, male   DOB: 14-Jun-1917, 79 y.o.   MRN: [id number]       PROGRESS NOTE  DATE: 07/13/2013   FACILITY: [facility]  LEVEL OF CARE: SNF (31)  Acute Visit  CHIEF COMPLAINT: Discharge Notes  HISTORY OF PRESENT ILLNESS: This is a 79 year old male who is for discharge home. He has been admitted to [facility] on 06/15/13 from [facility] with diagnosis of Encephalopathy.Patient was admitted to this facility for short-term rehabilitation after the patient's recent hospitalization. Most of the patient's medications has been discontinued. Patient is now comfort care at home.   Reassessment of ongoing problem(s):  CHF:No SOB nor chest pain. Digoxin is being tapered then will be discontinued.  ATRIAL FIBRILLATION: Rate-controlled; Coreg is being tapered then will be discontinued.  DEMENTIA with AGITATION : The dementia remaines advanced. Risperidone has been changed to PRN.  PAST MEDICAL HISTORY : Reviewed.  No changes.  CURRENT MEDICATIONS: Reviewed per [facility]  REVIEW OF SYSTEMS: unobtainable due to advanced Dementia.  PHYSICAL EXAMINATION  VS:  T97.6       P72       RR20      BP102/57      POX94 %       WT116.3 (Lb)  GENERAL: thin body habitus, sleeping but arousable EYES: conjunctivae normal, sclerae normal, normal eye lids NECK: supple, trachea midline, no neck masses, no thyroid tenderness, no thyromegaly RESPIRATORY: breathing is even & unlabored, BS CTAB CARDIAC: RRR, no murmur,no extra heart sounds, no edema GI: abdomen soft, normal BS, no masses, no tenderness, no hepatomegaly, no splenomegaly PSYCHIATRIC: the patient is alert & oriented to person, affect & behavior appropriate  LABS/RADIOLOGY: 06/26/13 sodium 139 potassium 4.0 glucose 148 BUN 30 creatinine 0.8 calcium 8.5 total protein 5.3 albumin 3.0 liver enzymes normal WBC 7.1 hemoglobin 9.6 hematocrit 31.9 06/20/13 sodium 137 potassium 4.6 glucose 120 BUN 40 creatinine  1.0 calcium 8.9   ASSESSMENT/PLAN:  Dementia with agitation - advanced; will have Home health Nursing at home for comfort care  CHF - stable; digoxin being tapered then discontinued  Atrial Fibrillation - rate-controlled; Coreg being tapered down then discontinued  BPH - Flomax and Proscar discontinued; will go home with foley catheter attached to urine bag  Protein Calorie Malnutrition - for comfort care  Anxiety - will continue Ativan PRN  Generalized Pain - discontinue allergy to Morphine. Family reported that patient is not really allergic to Morphine; will start Morphine Sulfate 20 mg/ml give 5 mg/0.25 ml SL Q 4 hours PRN  Polymyalgia Rheumatica - continue Prednisone 2.5 mg/2.5 ml PO Q D  I have filled out patient's discharge paperwork and written prescriptions.  Patient will receive home health Nursing.  Total discharge time:Less than 30 minutes Discharge time involved coordination of the discharge process with social worker, nursing staff and therapy department. Medical justification for home health services verified.  CPT CODE: 14782

## 2013-08-06 NOTE — Progress Notes (Signed)
Patient ID: Joshua Hester, male   DOB: 06/14/1917, 79 y.o.   MRN: [id number]       PROGRESS NOTE  DATE: 06/26/2013  FACILITY:  [facility]  LEVEL OF CARE: SNF (31)  Acute Visit  CHIEF COMPLAINT:  Manage Dementia with agitation and generalized pain  HISTORY OF PRESENT ILLNESS:This is a 79 year old male who is being seen due to agitation. He has been trying to pull out his foley catheter and trying to get up from wheelchair by himself. Patient tells staff to get out of his room. He has Polymyalgia rheumatica and currently on Prednisone.  PAST MEDICAL HISTORY : Reviewed.  No changes.  CURRENT MEDICATIONS: Reviewed per [facility]  REVIEW OF SYSTEMS:  GENERAL: no change in appetite, no fatigue, no weight changes, no fever, chills or weakness RESPIRATORY: no cough, SOB, DOE,, wheezing, hemoptysis CARDIAC: no chest pain, or palpitations, +edema GI: no abdominal pain, diarrhea, constipation, heart burn, nausea or vomiting  PHYSICAL EXAMINATION  VS:  T97        P64       RR12       BP130/82      POX95 %       WT129.6 (Lb)  GENERAL: no acute distress, normal body habitus EYES: conjunctivae normal, sclerae normal, normal eye lids NECK: supple, trachea midline, no neck masses, no thyroid tenderness, no thyromegaly RESPIRATORY: breathing is even & unlabored, BS CTAB CARDIAC: RRR, no murmur,no extra heart sounds, no edema GI: abdomen soft, normal BS, no masses, no tenderness, no hepatomegaly, no splenomegaly EXTREMITIES:  LUE edema, 2+ PSYCHIATRIC: the patient is alert & oriented to person, agitated and attempting to get up from wheelchair unassisted  LABS/RADIOLOGY: 06/26/13 sodium 139 potassium 4.0 glucose 148 BUN 30 creatinine 0.8 calcium 8.5 total protein 5.3 albumin 3.0 liver enzymes normal WBC 7.1 hemoglobin 9.6 hematocrit 31.9 06/20/13 sodium 137 potassium 4.6 glucose 120 BUN 40 creatinine 1.0 calcium 8.9  ASSESSMENT/PLAN:  Dementia with Agitation -  Start Risperidone  0.25 mg PO BID; discontinue Risperidone PRN  Generalized pain - start OxyIR 5 mg give 1/2 tab = 2.5 mg PO Q 6 hours PRN   CPT CODE: 40981

## 2013-08-09 DEATH — deceased

## 2013-09-29 ENCOUNTER — Encounter: Payer: Self-pay | Admitting: Internal Medicine

## 2014-03-14 ENCOUNTER — Telehealth: Payer: Self-pay | Admitting: Internal Medicine

## 2014-03-14 NOTE — Telephone Encounter (Signed)
Pt deceased 08/05/2013/mt

## 2014-03-15 IMAGING — CT CT HEAD W/O CM
2 series · 17 of 30 positions shown, 20 images · non-contrast
Comparison: 04/27/2010

CLINICAL DATA: Confusion and recent head injury

CT HEAD WITHOUT CONTRAST
TECHNIQUE: Contiguous axial images were obtained from the base of
the skull through the vertex without contrast.

[Series 2: head w/o · axial · non-contrast · 0.45mm/px · z∈[-84,+36]mm · 9 of 30 slices shown, 12 images]
[im 3/30  brain]
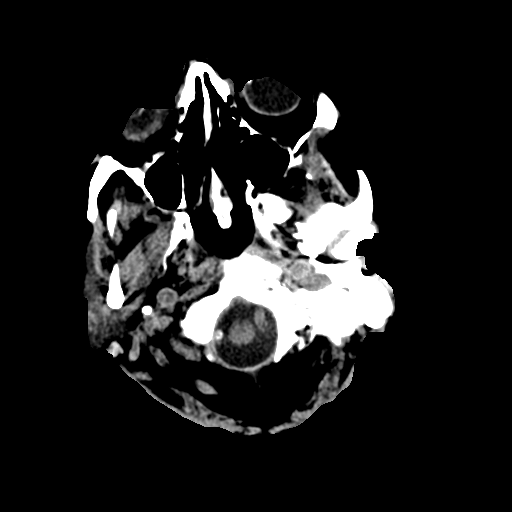
[im 3/30  bone]
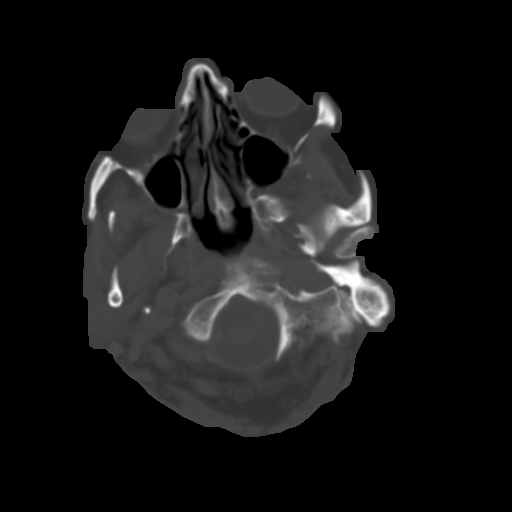
[im 6/30  brain]
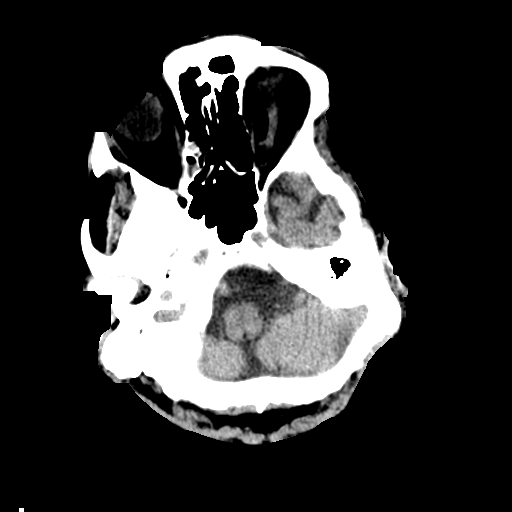
[im 9/30  brain]
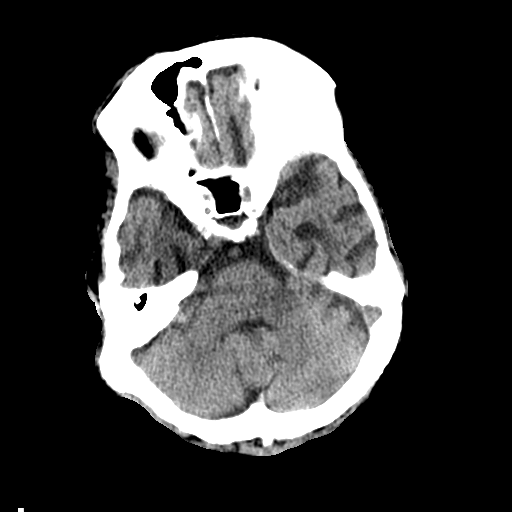
[im 12/30  brain]
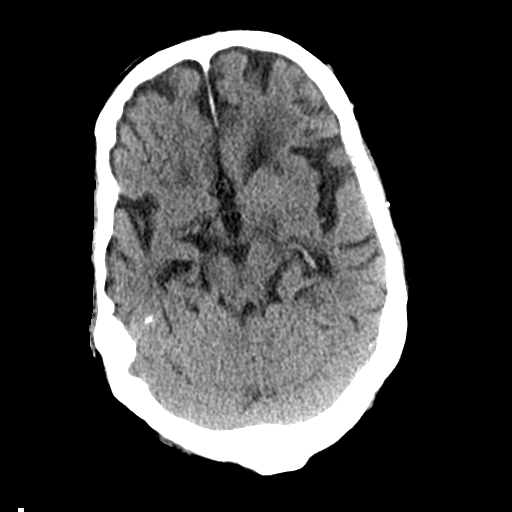
[im 15/30  brain]
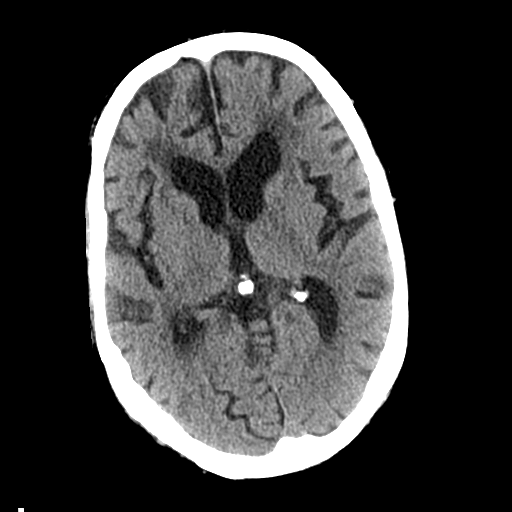
[im 15/30  bone]
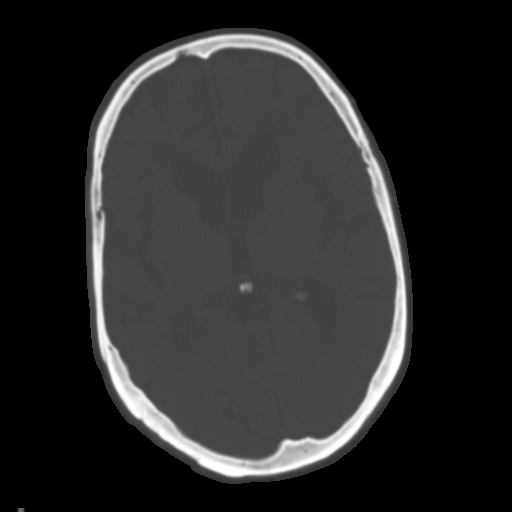
[im 18/30  brain]
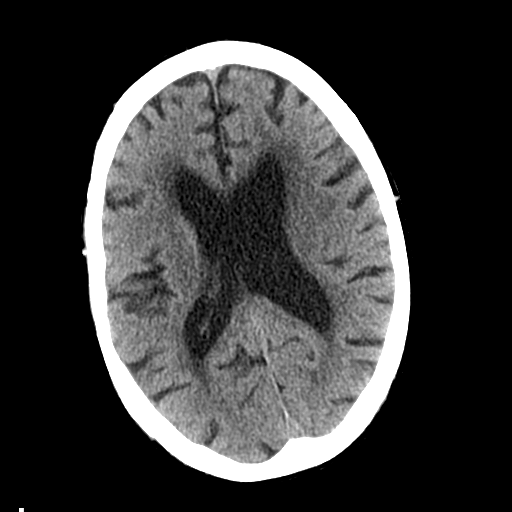
[im 21/30  brain]
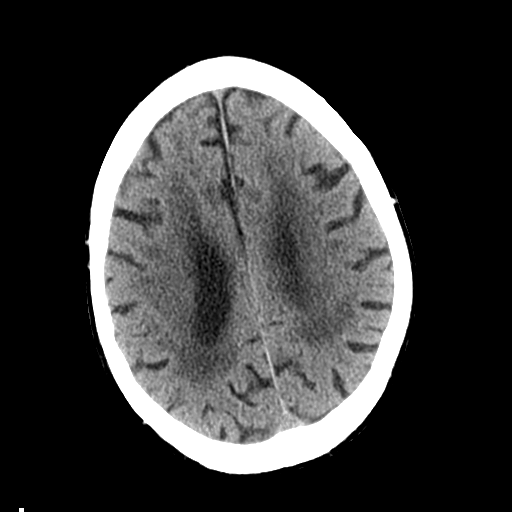
[im 24/30  brain]
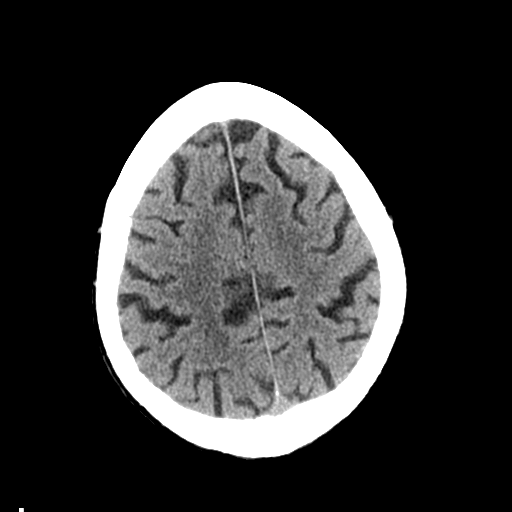
[im 27/30  brain]
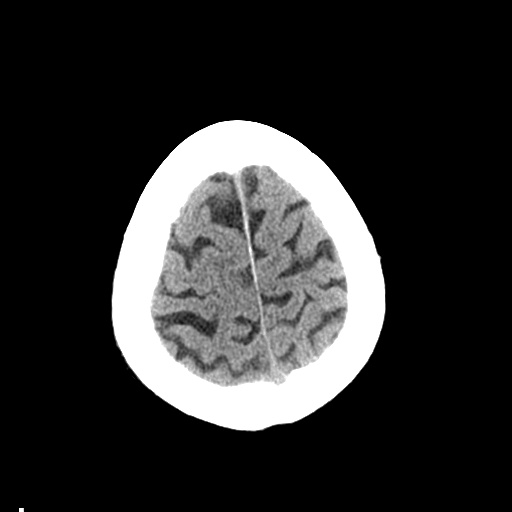
[im 27/30  bone]
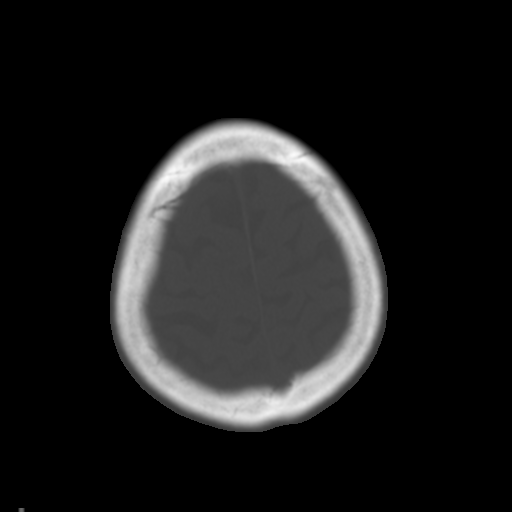

[Series 3: bone windows · axial · 0.45mm/px · z∈[-79,+35]mm · 8 of 50 slices shown]
[im 6/50  bone]
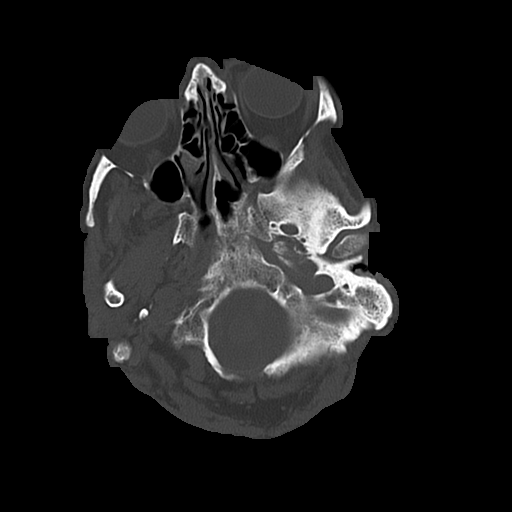
[im 11/50  bone]
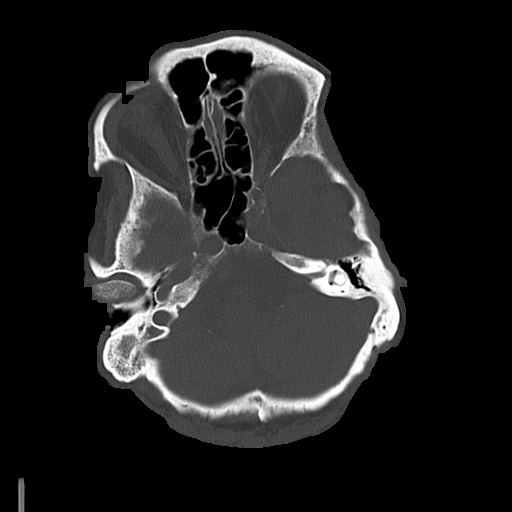
[im 17/50  bone]
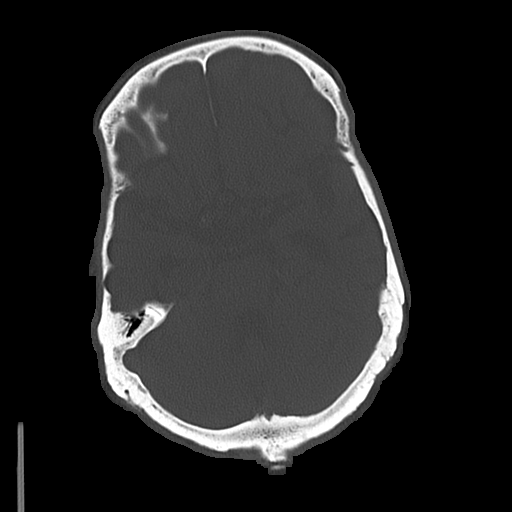
[im 22/50  bone]
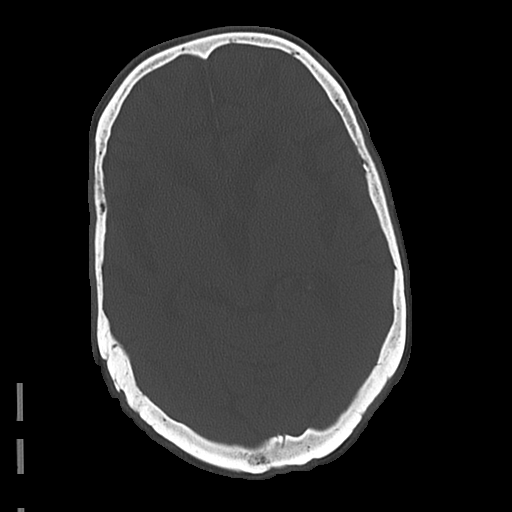
[im 28/50  bone]
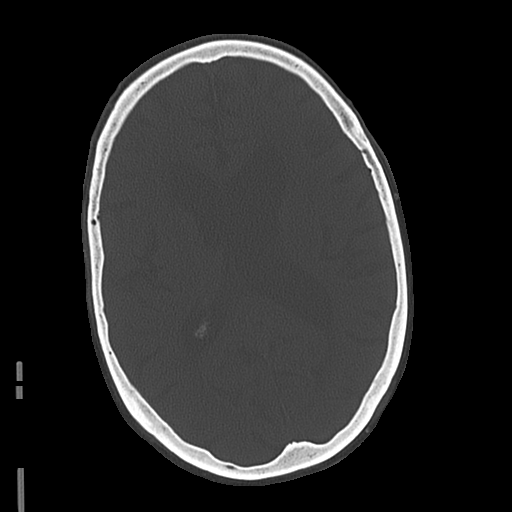
[im 33/50  bone]
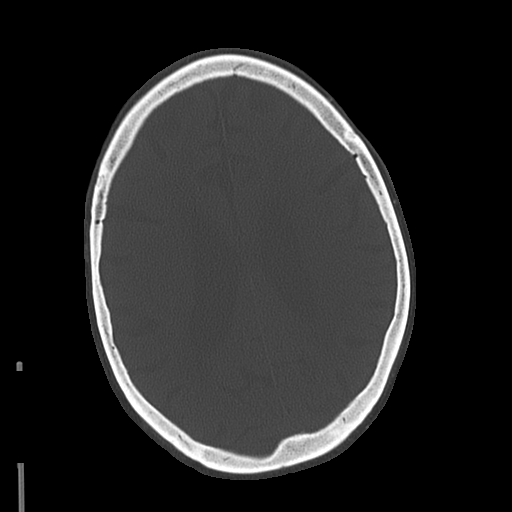
[im 39/50  bone]
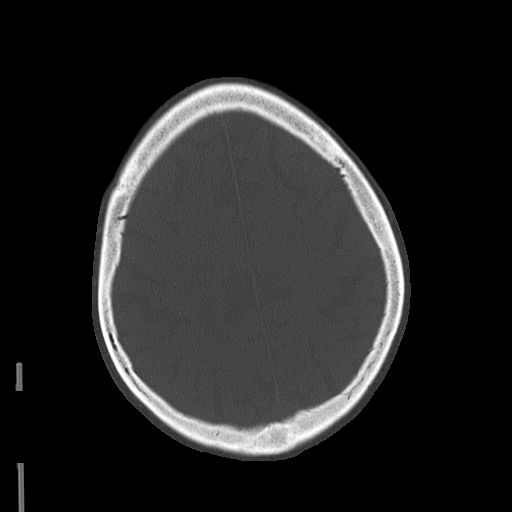
[im 44/50  bone]
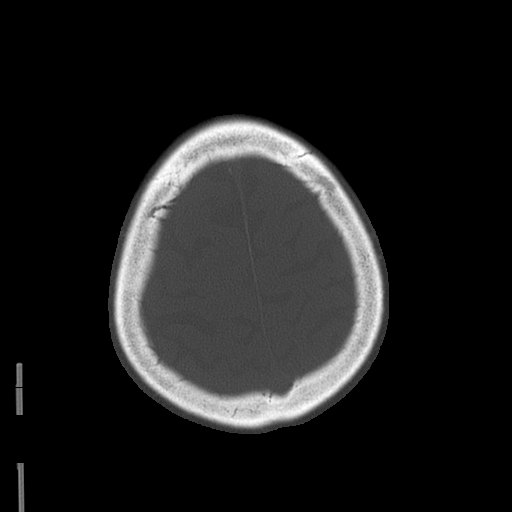

[17 of 30 positions shown; findings below may reference images not displayed]

FINDINGS: The bony calvarium is intact.  Atrophic changes and
chronic white matter ischemic changes are noted.  No findings to
suggest acute hemorrhage, acute infarction or space-occupying mass
lesion are noted.
IMPRESSION: Chronic changes without acute abnormality.

## 2014-03-15 IMAGING — CR DG HIP (WITH OR WITHOUT PELVIS) 2-3V*L*
3 series · 3 of 3 positions shown · non-contrast
Comparison: None.

CLINICAL DATA: Left hip pain

LEFT HIP - COMPLETE 2+ VIEW

[x pelvis]
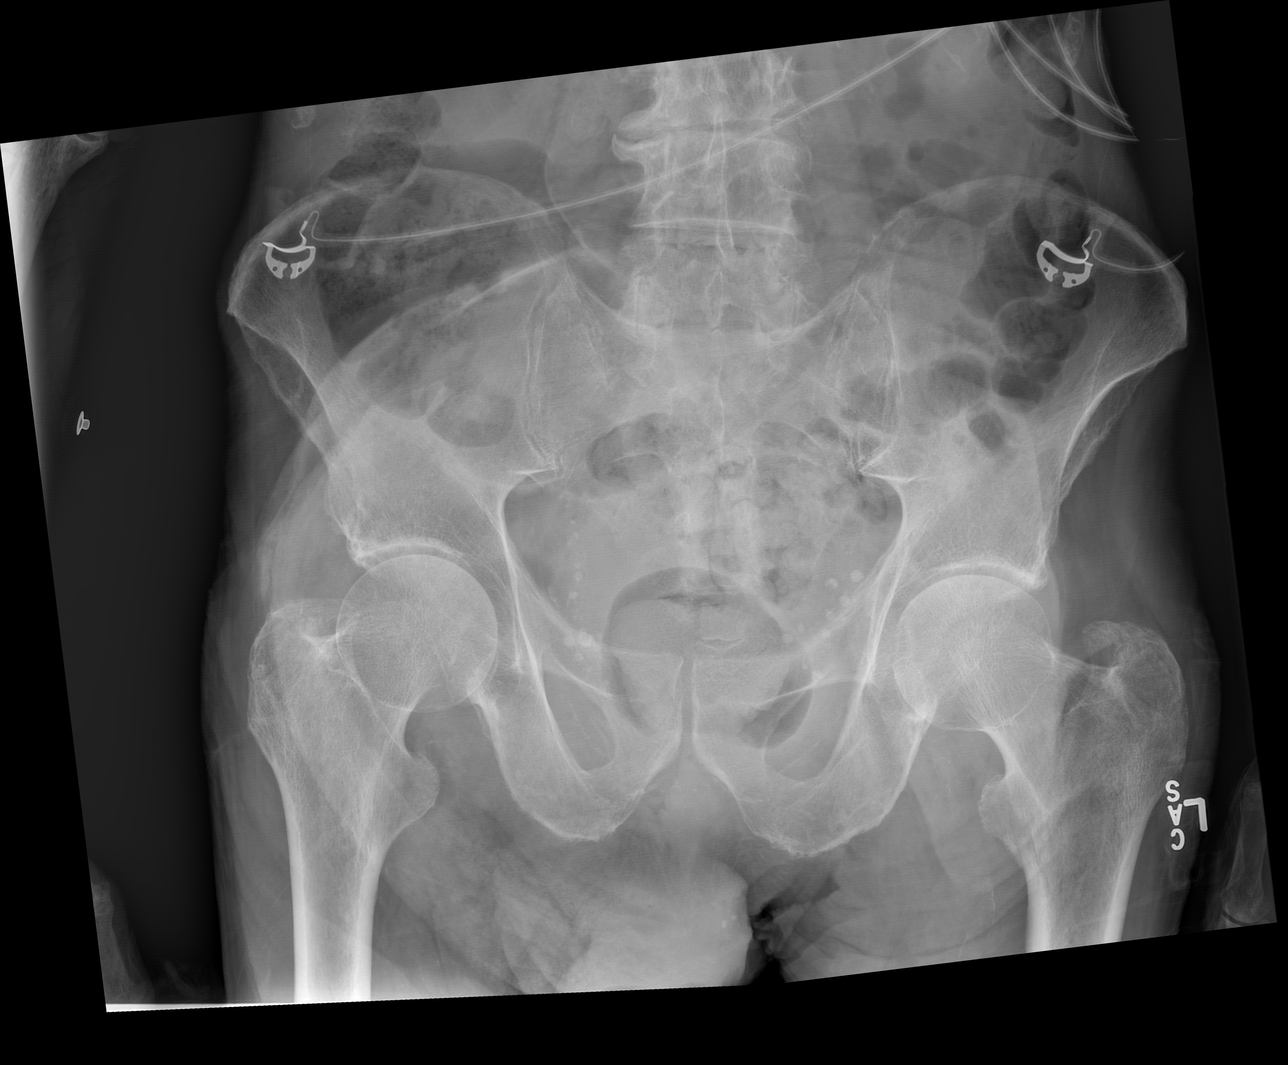

[x hip ap left]
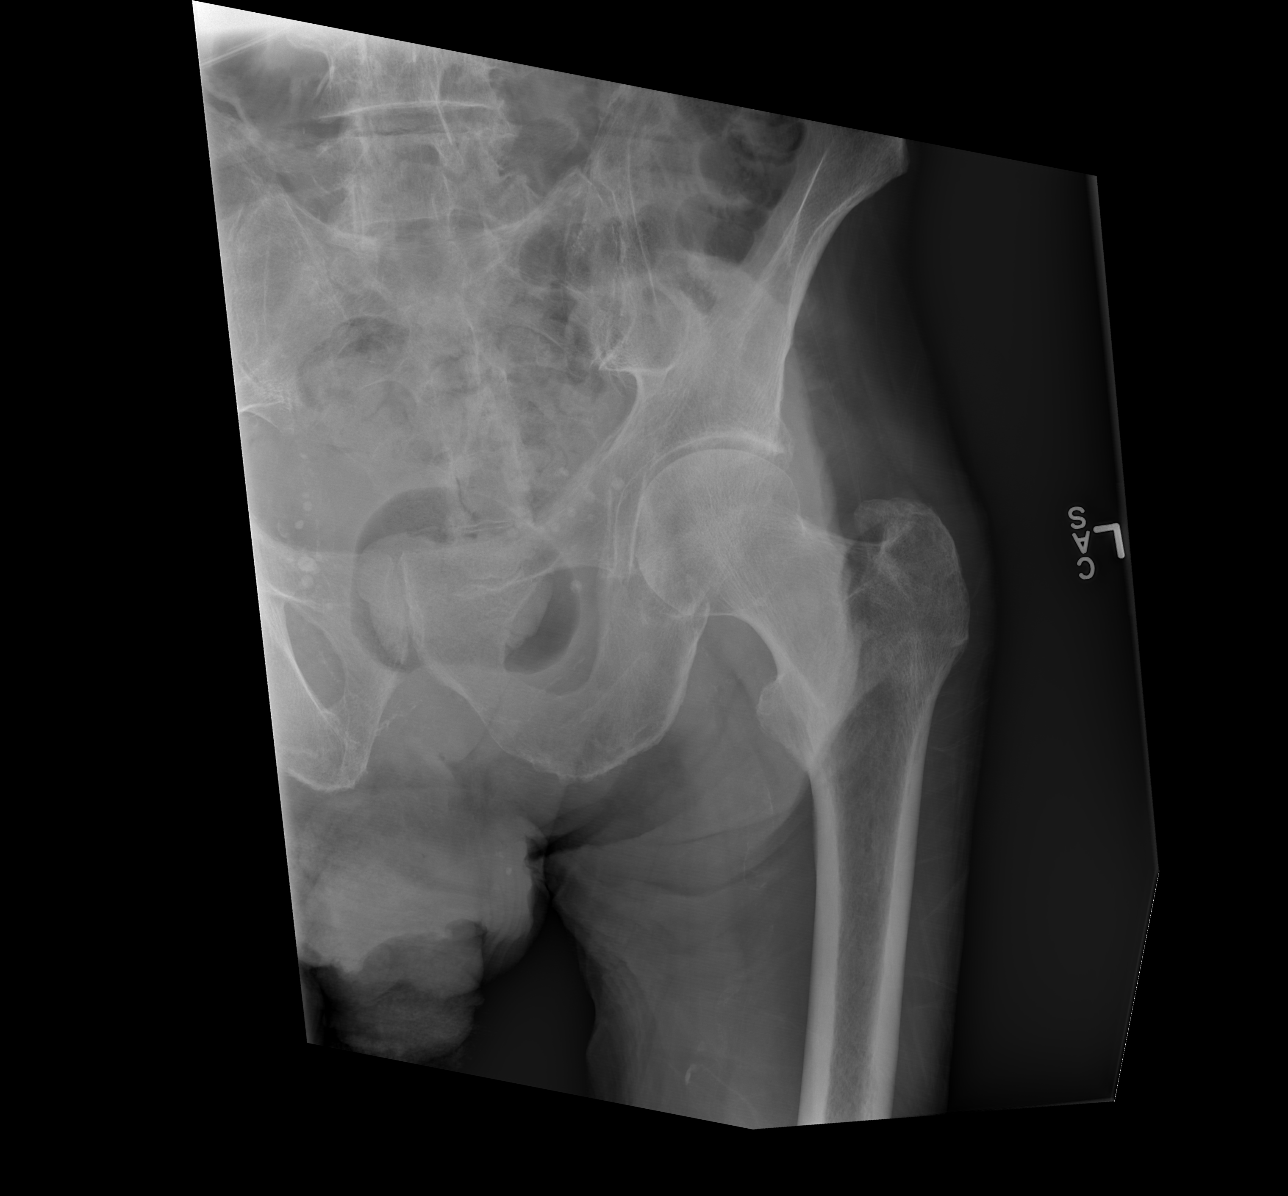

[x hip lat left]
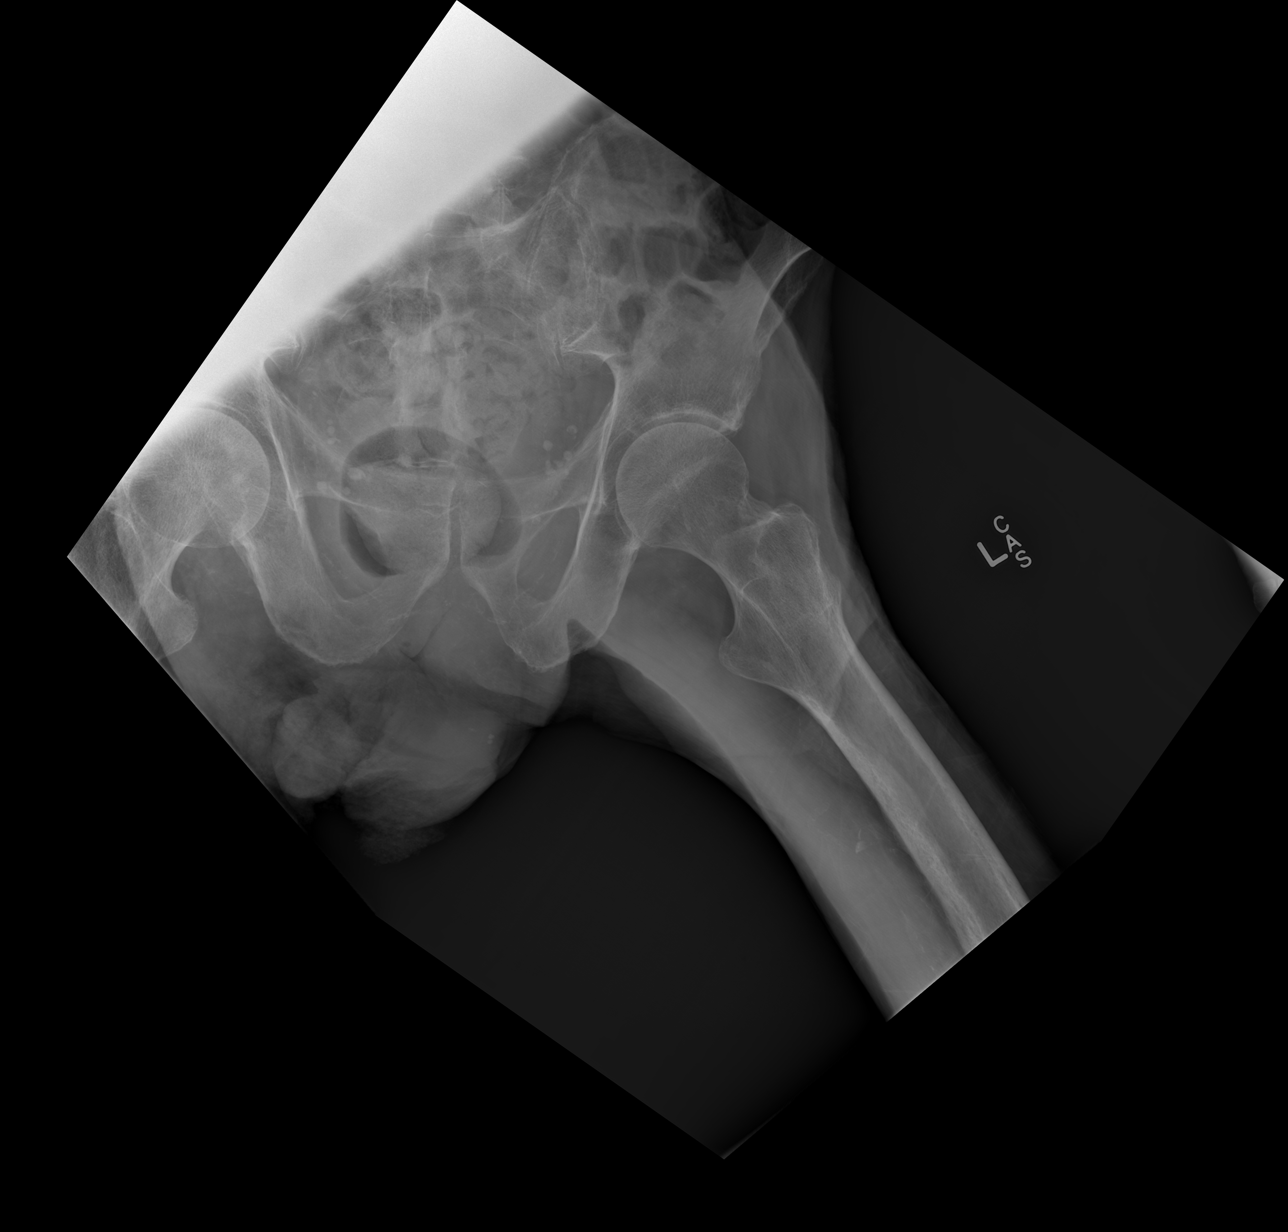

[3 of 3 positions shown; findings below may reference images not displayed]

FINDINGS: The pelvic ring is intact.  There is a area of cortical
angulation identified the superior aspect of the femoral neck
suspicious for a minimally displaced fracture.  No other focal
abnormality is noted.
IMPRESSION: Changes suspicious for an undisplaced subcapital femoral neck
fracture.  CT evaluation may be helpful

## 2014-04-25 ENCOUNTER — Telehealth: Payer: Self-pay

## 2014-04-25 NOTE — Telephone Encounter (Signed)
Patient past away per [facility]  °
